# Patient Record
Sex: Female | Born: 1965 | Race: White | Hispanic: No | Marital: Married | Smoking: Current every day smoker
Health system: Southern US, Community
[De-identification: ages and names within clinical notes are randomized; demographics above are authoritative.]

## PROBLEM LIST (undated history)

## (undated) DIAGNOSIS — E079 Disorder of thyroid, unspecified: Secondary | ICD-10-CM

## (undated) DIAGNOSIS — Z972 Presence of dental prosthetic device (complete) (partial): Secondary | ICD-10-CM

## (undated) DIAGNOSIS — J449 Chronic obstructive pulmonary disease, unspecified: Secondary | ICD-10-CM

## (undated) HISTORY — PX: WRIST GANGLION EXCISION: SUR520

---

## 1990-03-06 HISTORY — PX: BREAST CYST ASPIRATION: SHX578

## 2009-04-03 ENCOUNTER — Emergency Department: Payer: Self-pay | Admitting: Emergency Medicine

## 2011-02-17 ENCOUNTER — Ambulatory Visit: Payer: Self-pay | Admitting: Internal Medicine

## 2011-06-24 ENCOUNTER — Emergency Department: Payer: Self-pay | Admitting: Internal Medicine

## 2011-06-27 LAB — BETA STREP CULTURE(ARMC)

## 2011-10-18 ENCOUNTER — Ambulatory Visit: Payer: Self-pay | Admitting: Emergency Medicine

## 2012-02-17 ENCOUNTER — Ambulatory Visit: Payer: Self-pay

## 2012-02-17 LAB — CBC
HCT: 43.6 % (ref 35.0–47.0)
MCH: 32.4 pg (ref 26.0–34.0)
MCV: 98 fL (ref 80–100)
RBC: 4.46 10*6/uL (ref 3.80–5.20)
RDW: 13 % (ref 11.5–14.5)
WBC: 4.4 10*3/uL (ref 3.6–11.0)

## 2012-02-17 LAB — BASIC METABOLIC PANEL
Anion Gap: 7 (ref 7–16)
Calcium, Total: 9.3 mg/dL (ref 8.5–10.1)
Co2: 28 mmol/L (ref 21–32)
EGFR (African American): >60
Osmolality: 281 (ref 275–301)

## 2012-02-18 ENCOUNTER — Inpatient Hospital Stay: Payer: Self-pay | Admitting: Internal Medicine

## 2012-02-18 LAB — MAGNESIUM: Magnesium: 1.5 mg/dL — ABNORMAL LOW

## 2012-02-19 LAB — CBC WITH DIFFERENTIAL/PLATELET
Basophil #: 0 10*3/uL (ref 0.0–0.1)
Eosinophil #: 0 10*3/uL (ref 0.0–0.7)
Eosinophil %: 0 %
HCT: 40.7 % (ref 35.0–47.0)
Lymphocyte #: 0.8 10*3/uL — ABNORMAL LOW (ref 1.0–3.6)
Lymphocyte %: 11.7 %
MCH: 32.2 pg (ref 26.0–34.0)
MCV: 97 fL (ref 80–100)
Monocyte #: 0.5 x10 3/mm (ref 0.2–0.9)
Neutrophil %: 81 %
Platelet: 166 10*3/uL (ref 150–440)
RBC: 4.18 10*6/uL (ref 3.80–5.20)
RDW: 13 % (ref 11.5–14.5)
WBC: 7 10*3/uL (ref 3.6–11.0)

## 2012-02-19 LAB — BASIC METABOLIC PANEL
Anion Gap: 6 — ABNORMAL LOW (ref 7–16)
BUN: 11 mg/dL (ref 7–18)
Co2: 30 mmol/L (ref 21–32)
Creatinine: 0.49 mg/dL — ABNORMAL LOW (ref 0.60–1.30)
EGFR (African American): >60
EGFR (Non-African Amer.): >60
Glucose: 120 mg/dL — ABNORMAL HIGH (ref 65–99)
Osmolality: 282 (ref 275–301)
Sodium: 141 mmol/L (ref 136–145)

## 2012-02-19 LAB — TSH: Thyroid Stimulating Horm: 6.25 u[IU]/mL — ABNORMAL HIGH

## 2012-02-19 LAB — T4, FREE: Free Thyroxine: 0.67 ng/dL — ABNORMAL LOW (ref 0.76–1.46)

## 2012-02-19 LAB — HCG, QUANTITATIVE, PREGNANCY: Beta Hcg, Quant.: 2 m[IU]/mL

## 2012-06-23 ENCOUNTER — Emergency Department: Payer: Self-pay | Admitting: Emergency Medicine

## 2013-08-16 ENCOUNTER — Ambulatory Visit: Payer: Self-pay | Admitting: Emergency Medicine

## 2013-12-22 IMAGING — CR DG CHEST 2V
1 series · 2 of 2 positions shown · non-contrast
Comparison: none

REASON FOR EXAM: SOB
COMMENTS:

[Series 1: pa · 0.17mm/px · 2 of 2 slices shown]
[im 1/2]
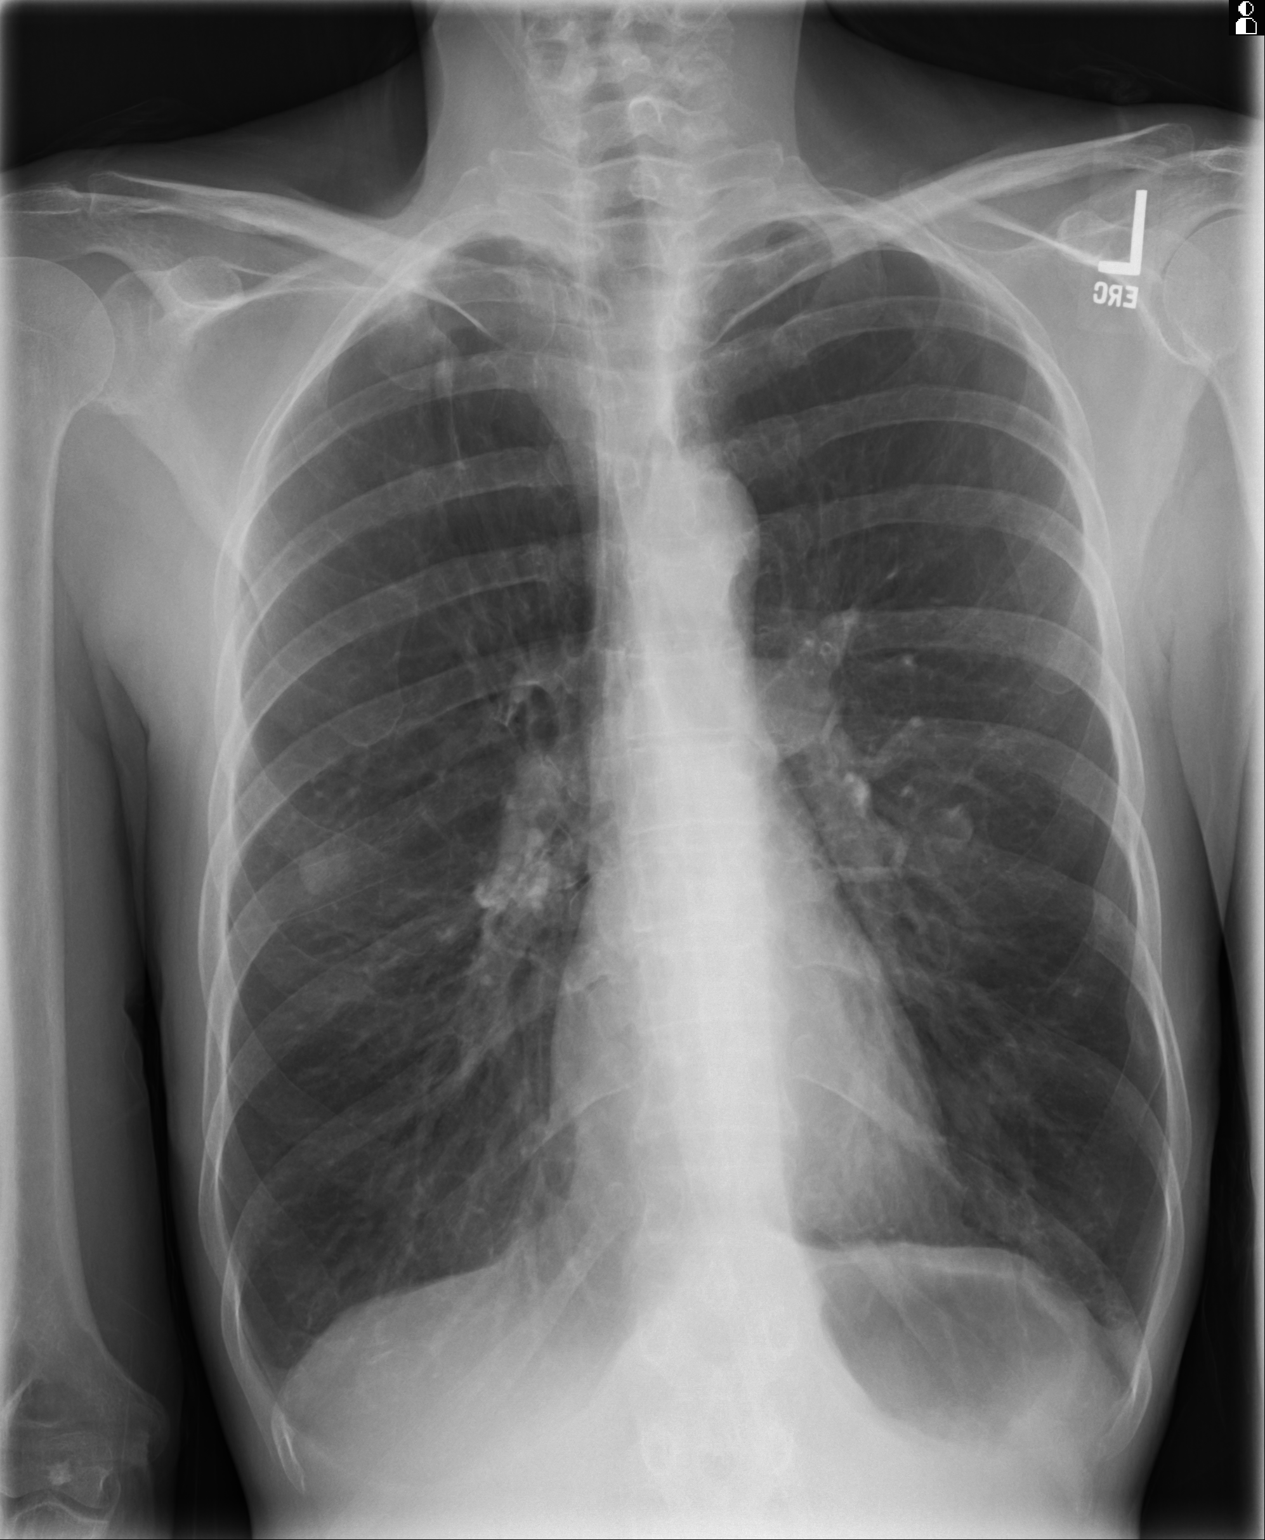
[im 2/2]
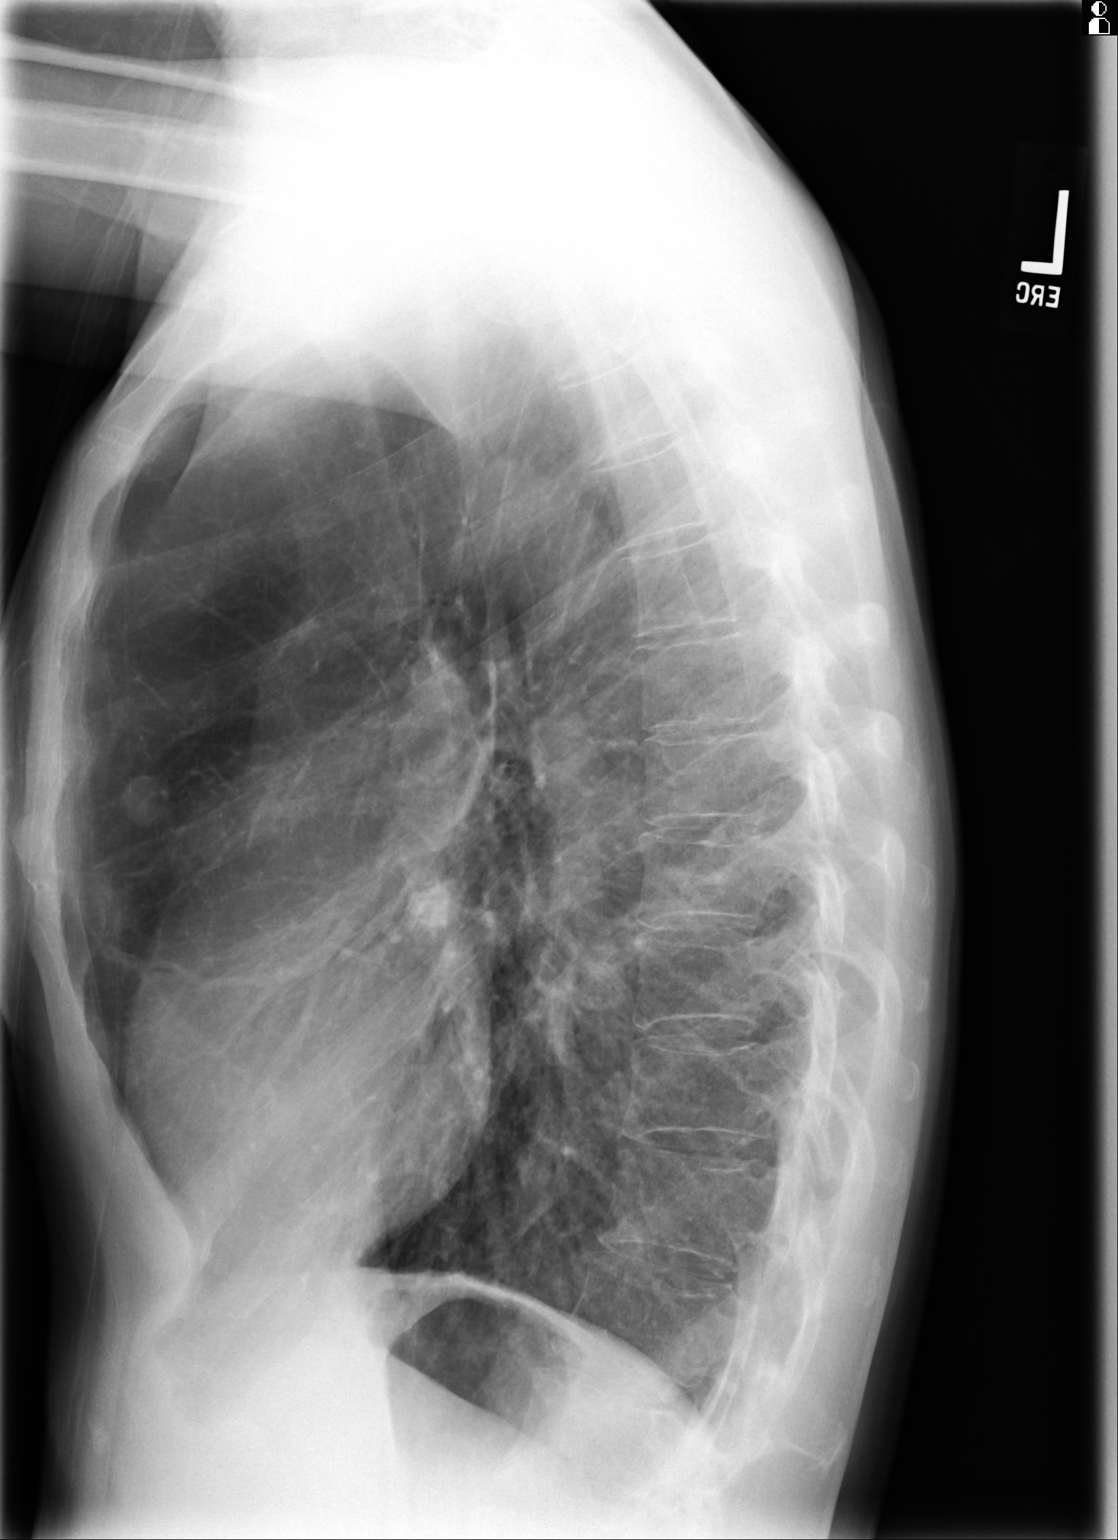

[2 of 2 positions shown; findings below may reference images not displayed]

PROCEDURE:     DXR - DXR CHEST PA (OR AP) AND LATERAL  - February 17, 2012 [DATE]

RESULT:     The lungs are hyperinflated consistent with emphysematous lung
disease. The cardiac silhouette is normal. There is an ill-defined nodular
density projecting of the lower margin of the anterior fifth rib. This
appears to project over the anterior lung field on lateral view. This may
represent a nipple shadow. Followup PA and lateral views of the chest with
nipple markers are recommended. There is no effusion, edema, pneumothorax or
other mass.
IMPRESSION: Nodular density presumably secondary to nipple shadow.
Followup PA and lateral views with nipple markers is recommended. Diffuse
hyperinflation consistent with emphysematous lung disease.

[REDACTED]

## 2014-06-23 NOTE — H&P (Signed)
PATIENT NAME:  April Sweeney, April Sweeney DATE OF BIRTH:  1965/07/25  DATE OF ADMISSION:  02/18/2012  REFERRING PHYSICIAN: Dr. Lucrezia EuropeAllison Webster.   PRIMARY CARE PHYSICIAN: None.   CHIEF COMPLAINT: Shortness of breath, cough, productive sputum.   HISTORY OF PRESENT ILLNESS: This is a 49 year old female with history of hypothyroidism, who presents with complaints of cough, productive sputum and shortness of breath. The patient reports she has been having these symptoms for the last 2 to 3 days. [facility], where she was given steroids, started on azithromycin and given nebulized treatment. The patient reports she went home and her symptoms improved, and she reports her cough and shortness of breath has been worsening, which prompted her to come to the ED. In the ED, the patient was noted to be saturating in the mid 80s, and while ambulating she dropped to the 70s. She had decreased air entry with diffuse wheezing and despite receiving multiple nebs and IV Solu-Medrol she is still having significant wheezing, so hospitalist service was requested to admit the patient. The patient denies any previous diagnosis of COPD. The patient reports she has been having productive cough with dark green sputum. The patient reports she already took p.o. azithromycin today at home as she was prescribed by urgent care. The patient was noticed to have multiple PVCs. Her magnesium level was 1.5, which was replaced in the ED. The patient reports she has history of hypothyroidism, but she stopped taking thyroid Armour on her own before _____ and reports she has not been seeing PCP or any physician since then.   PAST MEDICAL HISTORY: Hypothyroidism, but not taking any meds for that.   PAST SURGICAL HISTORY:  1.  Cyst removal from her arm.  2.  C-section.   ALLERGIES: PERCOCET.   HOME MEDICATIONS: None. She is supposed to be taking thyroid Amour, but she has not taken it for 1 year.   FAMILY  HISTORY: Significant for hypertension in her mother.   SOCIAL HISTORY: The patient smokes 1 pack per day since the age of 49, so has been smoking for the last 34 years. Reports she decided to stop smoking and today is the first day she stopped smoking. No alcohol or illicit drug use. She works in Clinical biochemistcustomer service.   REVIEW OF SYSTEMS:  CONSTITUTIONAL: Denies any fever or chills. Complains of generalized weakness and fatigue.  EYES: Denies blurry vision, double vision or pain.  ENT: Denies tinnitus, ear pain, hearing loss.  RESPIRATORY: Complains of cough with green, productive sputum. Has wheezing. Denies any painful respiratory or history of TB.  CARDIOVASCULAR: Denies chest pain, orthopnea, edema, arrhythmia or palpitations.  GASTROINTESTINAL: Denies nausea, vomiting, diarrhea, abdominal pain, hematemesis.  GENITORUINARY: Denies dysuria, hematuria, renal colic.  ENDOCRINE: Denies polyuria, polydipsia, heat or cold intolerance.  HEMATOLOGY: Denies anemia, easy bruising, bleeding diathesis.  INTEGUMENTARY: Denies acne, rash or skin lesions.  MUSCULOSKELETAL: Denies neck pain, shoulder pain, knee pain.  NEUROLOGIC: Denies numbness, dysarthria, epilepsy, tremors, vertigo, ataxia, dementia.  PSYCHIATRIC: Denies anxiety, insomnia, schizophrenia or nervousness.   PHYSICAL EXAMINATION:  VITAL SIGNS: Temperature 98.1, pulse 99, respiratory 22, blood pressure 131/62, saturating 91% on oxygen.  GENERAL: Frail female sitting comfortably in no apparent distress.  HEENT: Head is atraumatic, normocephalic. Pupils equal and reactive to light. Pink conjunctivae. Anicteric sclerae. Moist oral mucosa.  NECK: Supple. No thyromegaly. No JVD.  CHEST: Decreased air entry bilaterally with diffuse wheezing. No rales or rhonchi.  CARDIOVASCULAR: S1, S2  heard. No rubs, murmurs, or gallops.  ABDOMEN: Soft, nontender, nondistended. Bowel sounds present.  EXTREMITIES: No edema. No clubbing. No cyanosis.   PSYCHIATRIC: Appropriate affect. Awake, alert x3. Intact judgment and insight and cranial nerves grossly intact. Motor 5 out of 5.  SKIN: Warm and dry. Normal skin turgor.   PERTINENT LABORATORIES: Glucose 139, BUN 6, creatinine 0.58, sodium 141, potassium 3.5, chloride 106, CO2 28, magnesium 1.5. White blood cells 4.4, hemoglobin 14.4, hematocrit 43.6, platelets 177. Chest x-ray does not show any infiltrate. It shows COPD presentation with hyperextended lungs. Still awaiting official reading.   ASSESSMENT AND PLAN:  1.  Chronic obstructive pulmonary disease exacerbation, this is new diagnosis for the patient. The patient will be started on IV Solu-Medrol 80 mg every 6 hours and DuoNeb every 4 hours and p.r.n. albuterol. She will be kept on oxygen via nasal cannula to keep her oxygen saturation more than  95%. As she is having productive cough with dark green sputum, she will be started on broad-spectrum intravenous antibiotics. She will be started on intravenous Zosyn. At this point, we will avoid erythromycin or Levaquin secondary to QT prolongation.  2.  History of hypothyroidism. The patient stopped taking thyroid Armour on her own before 1 year. Will check TSH and FT4 and if needed will resume her on Synthroid.  3.  Tobacco abuse. The patient was counseled and reports she quit smoking. She will be started on Nicoderm patch.  4.  Multiple premature ventricular contractions. The patient was given magnesium. Will monitor her on telemetry.  5.  Hypomagnesemia. Replaced. We will recheck level in a.m.  6.  Deep vein thrombosis prophylaxis. Subcutaneous heparin.  7.  Gastrointestinal prophylaxis. We will start the patient on proton pump inhibitor, especially with her being on large dose of steroids.  8.  CODE STATUS: Full code.   TOTAL TIME SPENT ON ADMISSION AND PATIENT CARE: 55 minutes.   ____________________________ Starleen Arms, MD dse:aw D: 02/18/2012 05:39:06 ET T: 02/18/2012  13:15:44 ET JOB#: [id number]  cc: Starleen Arms, MD, <Dictator> Kenedee Molesky Teena Irani MD ELECTRONICALLY SIGNED 02/19/2012 4:24

## 2014-06-26 NOTE — Discharge Summary (Signed)
PATIENT NAME:  April Sweeney, GANSON MR#:  [id number] DATE OF BIRTH:  26-Mar-1965  DATE OF ADMISSION:  02/18/2012 DATE OF DISCHARGE:  02/22/2012  PRIMARY CARE PHYSICIAN: At the [facility].  FINAL DIAGNOSES:  1. Acute respiratory failure, now chronic requiring home oxygen.  2. Chronic obstructive pulmonary disease exacerbation.  3. Left lung nodule. Recommend a repeat CT scan of the chest in four months.  4. Tobacco abuse.  5. Insomnia. 6. Abnormal EKG.  7. Hypomagnesemia.  8. Constipation.   MEDICATIONS ON DISCHARGE: Include prednisone 5 mg 4 tabs day one, 3 tabs day two and three, 2 tabs day 4 and 5, 1 tab day 6 and 7, albuterol ipratropium CFC 1 puff 4 times a day, Spiriva 1 inhalation daily, Flovent CFC 110 mcg 1 puff twice a day, nicotine patch 21 mg per 24 hour transdermal _____ one patch daily, doxycycline 100 mg twice a day for 7 days, oxygen 3 liters nasal cannula with portable tank.   DIET: Regular.   ACTIVITY: As tolerated. I did give the number of a pulmonologist, Dr. Welton Flakes. Patient probably looking to get insurance first.  FOLLOWUP: Followup in the [facility] as scheduled.   HOSPITAL COURSE: The patient was admitted 02/18/2012 and discharged 02/22/2012. The  patient came in with shortness of breath, cough and productive sputum, was admitted with COPD exacerbation, acute respiratory failure. Was started on high dose Solu-Medrol. She also had an abnormal EKG with slight QT prolongation. Laboratory and radiological data during the hospital course included glucose 139, BUN 6, creatinine 0.58, sodium 141, potassium 3.5, chloride 106, CO2 of 28, calcium 9.3. White blood cell count 4.4, H and H 14.4 and 43.6, platelet count of 177. Chest x-ray showed nodular density presumably secondary to nipple shadow, emphysematous disease. EKG showed sinus bradycardia, AV disassociation, accelerated junctional rhythm, supraventricular complexes. Influenza A and B negative. Magnesium 1.5.  Beta HCG negative. TSH 6.25, free thyroxine 0.67. Repeat magnesium 1.8 CT scan of the chest with pulmonary embolism protocol showed no evidence of pulmonary embolism or acute thoracic aortic pathology, emphysematous changes bilaterally. They did comment on a 4 mm diameter nodule on the major fissure on the right. Pulse ox room air 88%.  HOSPITAL COURSE PER PROBLEM LIST:  1. For the patient's acute respiratory failure, this is now chronic requiring home oxygen. Patient will be on oxygen 24/7.  2. COPD exacerbation. The patient took a long time to break. High dose Solu-Medrol was given. That was tapered down to medium dose and flipped over to prednisone taper upon discharge. The patient was started on Combivent CFC, Flovent CFC and also given Spiriva. Prednisone taper was given. Doxycycline for COPD exacerbation. The patient's respiratory status had improved. She had very poor air entry when she came in. Was moving air upon discharge. Did have wheeze. She was feeling much better with regards to her breathing. The patient was discharged home in stable condition.  3. Lung nodule, very small, too small to biopsy. I recommend followup with a CT scan of the chest in 4 months to ensure stability.  4. Tobacco abuse. Smoking cessation counseling done during the hospitalization 3 minutes by me. Nicotine patch applied.  5. Insomnia: The patient was given Lunesta here. At home, she will probably sleep better.  6. Abnormal EKG most likely secondary to COPD. 7. Hypomagnesemia. This was replaced during the hospitalization.  8. Constipation. This was treated during the hospitalization.   TIME SPENT ON DISCHARGE: 40 minutes.    ____________________________  Ademola Vert J. Renae GlossWieting, MD rjw:es D: 02/22/2012 16:31:29 ET T: 02/23/2012 08:47:14 ET JOB#: [id number]  cc: Herschell Dimesichard J. Renae GlossWieting, MD, <Dictator> [facility] Salley ScarletICHARD J Naziah Portee MD ELECTRONICALLY SIGNED 03/07/2012 12:39

## 2014-06-29 ENCOUNTER — Ambulatory Visit
Admit: 2014-06-29 | Disposition: A | Discharge status: Home / Self Care | Payer: Self-pay | Attending: Family Medicine | Admitting: Family Medicine

## 2014-12-31 ENCOUNTER — Ambulatory Visit
Admission: EM | Admit: 2014-12-31 | Discharge: 2014-12-31 | Disposition: A | Discharge status: Home / Self Care | Payer: Self-pay | Attending: Family Medicine | Admitting: Family Medicine

## 2014-12-31 DIAGNOSIS — B370 Candidal stomatitis: Secondary | ICD-10-CM

## 2014-12-31 HISTORY — DX: Disorder of thyroid, unspecified: E07.9

## 2014-12-31 HISTORY — DX: Chronic obstructive pulmonary disease, unspecified: J44.9

## 2014-12-31 MED ORDER — NYSTATIN 100000 UNIT/ML MT SUSP: OROMUCOSAL | Status: DC

## 2014-12-31 NOTE — ED Provider Notes (Signed)
CSN: [id number]     Arrival date & time 12/31/14  1848 History   First MD Initiated Contact with Patient 12/31/14 1959     Chief Complaint  Patient presents with  . Mouth Lesions   (Consider location/radiation/quality/duration/timing/severity/associated sxs/prior Treatment) HPI   This a 49 year old female who been diagnosed with a URI. She's been using Spiriva and Advair and because of the cold; she has not been cleaning her mouth adequately following the Advair usage. She now presents with sores in her mouth and a white tongue. Complaining of a bitter taste in her mouth.  Past Medical History  Diagnosis Date  . COPD (chronic obstructive pulmonary disease) (HCC)   . Thyroid disease    Past Surgical History  Procedure Laterality Date  . Cesarean section    . Wrist ganglion excision     History reviewed. No pertinent family history. Social History  Substance Use Topics  . Smoking status: Current Every Day Smoker -- 0.25 packs/day  . Smokeless tobacco: Never Used  . Alcohol Use: No   OB History    Gravida Para Term Preterm AB TAB SAB Ectopic Multiple Living   2 2             Review of Systems  Constitutional: Negative for fever, appetite change and fatigue.  HENT: Positive for mouth sores.   All other systems reviewed and are negative.   Allergies  Percocet  Home Medications   Prior to Admission medications   Medication Sig Start Date End Date Taking? Authorizing Provider  albuterol (PROVENTIL HFA;VENTOLIN HFA) 108 (90 BASE) MCG/ACT inhaler Inhale into the lungs every 6 (six) hours as needed for wheezing or shortness of breath.   Yes Historical Provider, MD  doxycycline (VIBRAMYCIN) 100 MG capsule Take 100 mg by mouth 2 (two) times daily.   Yes Historical Provider, MD  Fluticasone-Salmeterol (ADVAIR) 100-50 MCG/DOSE AEPB Inhale 1 puff into the lungs 2 (two) times daily.   Yes Historical Provider, MD  levothyroxine (SYNTHROID, LEVOTHROID) 50 MCG tablet Take 50 mcg by  mouth daily before breakfast.   Yes Historical Provider, MD  predniSONE (DELTASONE) 20 MG tablet Take 20 mg by mouth daily with breakfast.   Yes Historical Provider, MD  tiotropium (SPIRIVA) 18 MCG inhalation capsule Place 18 mcg into inhaler and inhale daily.   Yes Historical Provider, MD  nystatin (MYCOSTATIN) 100000 UNIT/ML suspension 5 ml by mouth QID.Swish around for as long as possible then swallow. Use for 14 days. 12/31/14   Lutricia Feil, PA-C   Meds Ordered and Administered this Visit  Medications - No data to display  BP 129/87 mmHg  Pulse 73  Temp(Src) 98 F (36.7 C) (Oral)  Resp 16  Ht  (1.651 m)  Wt 114 lb (51.71 kg)  BMI 18.97 kg/m2  SpO2 99% No data found.   Physical Exam  Constitutional: She is oriented to person, place, and time. She appears well-developed and well-nourished. No distress.  HENT:  Head: Normocephalic and atraumatic.  Admission of her oropharynx shows a white coating on her tongue and white coating covering the buccal surfaces of her cheeks.. Adherent to the surface. There are no open sores. There is erythema underlying.  Eyes: Pupils are equal, round, and reactive to light.  Musculoskeletal: Normal range of motion. She exhibits no edema or tenderness.  Neurological: She is alert and oriented to person, place, and time.  Skin: Skin is warm and dry. She is not diaphoretic.  Psychiatric: She has a normal  mood and affect. Her behavior is normal. Judgment and thought content normal.  Nursing note and vitals reviewed.   ED Course  Procedures (including critical care time)  Labs Review Labs Reviewed - No data to display  Imaging Review No results found.   Visual Acuity Review  Right Eye Distance:   Left Eye Distance:   Bilateral Distance:    Right Eye Near:   Left Eye Near:    Bilateral Near:         MDM   1. Oral candidiasis    Discharge Medication List as of 12/31/2014  8:15 PM    START taking these medications    Details  nystatin (MYCOSTATIN) 100000 UNIT/ML suspension 5 ml by mouth QID.Swish around for as long as possible then swallow. Use for 14 days., Print      Plan: 1. Diagnosis reviewed with patient 2. rx as per orders; risks, benefits, potential side effects reviewed with patient 3. Recommend supportive treatment with strict washing of mouth. 4. F/u prn if symptoms worsen or don't improve     Lutricia FeilWilliam P Hence Derrick, PA-C 12/31/14 2029

## 2014-12-31 NOTE — ED Notes (Signed)
Possible thrush. Pt has been sick with a URI, and has been taking Spiriva, Advair, and a rescue inhaler.

## 2014-12-31 NOTE — Discharge Instructions (Signed)
Thrush, Adult  Thrush, also called oral candidiasis, is a fungal infection that develops in the mouth and throat and on the tongue. It causes white patches to form on the mouth and tongue. Thrush is most common in older adults, but it can occur at any age.   Many cases of thrush are mild, but this infection can also be more serious. Thrush can be a recurring problem for people who have chronic illnesses or who take medicines that limit the body's ability to fight infection. Because these people have difficulty fighting infections, the fungus that causes thrush can spread throughout the body. This can cause life-threatening blood or organ infections.  CAUSES   Thrush is usually caused by a yeast called Candida albicans. This fungus is normally present in small amounts in the mouth and on other mucous membranes. It usually causes no harm. However, when conditions are present that allow the fungus to grow uncontrolled, it invades surrounding tissues and becomes an infection. Less often, other Candida species can also lead to thrush.   RISK FACTORS  Thrush is more likely to develop in the following people:  · People with an impaired ability to fight infection (weakened immune system).    · Older adults.    · People with HIV.    · People with diabetes.    · People with dry mouth (xerostomia).    · Pregnant women.    · People with poor dental care, especially those who have false teeth.    · People who use antibiotic medicines.    SIGNS AND SYMPTOMS   Thrush can be a mild infection that causes no symptoms. If symptoms develop, they may include:   · A burning feeling in the mouth and throat. This can occur at the start of a thrush infection.    · White patches that adhere to the mouth and tongue. The tissue around the patches may be red, raw, and painful. If rubbed (during tooth brushing, for example), the patches and the tissue of the mouth may bleed easily.    · A bad taste in the mouth or difficulty tasting foods.     · Cottony feeling in the mouth.    · Pain during eating and swallowing.  DIAGNOSIS   Your health care provider can usually diagnose thrush by looking in your mouth and asking you questions about your health.   TREATMENT   Medicines that help prevent the growth of fungi (antifungals) are the standard treatment for thrush. These medicines are either applied directly to the affected area (topical) or swallowed (oral). The treatment will depend on the severity of the condition.   Mild Thrush  Mild cases of thrush may clear up with the use of an antifungal mouth rinse or lozenges. Treatment usually lasts about 14 days.   Moderate to Severe Thrush  · More severe thrush infections that have spread to the esophagus are treated with an oral antifungal medicine. A topical antifungal medicine may also be used.    · For some severe infections, a treatment period longer than 14 days may be needed.    · Oral antifungal medicines are almost never used during pregnancy because the fetus may be harmed. However, if a pregnant woman has a rare, severe thrush infection that has spread to her blood, oral antifungal medicines may be used. In this case, the risk of harm to the mother and fetus from the severe thrush infection may be greater than the risk posed by the use of antifungal medicines.    Persistent or Recurrent Thrush  For cases of   thrush that do not go away or keep coming back, treatment may involve the following:   · Treatment may be needed twice as long as the symptoms last.    · Treatment will include both oral and topical antifungal medicines.    · People with weakened immune systems can take an antifungal medicine on a continuous basis to prevent thrush infections.    It is important to treat conditions that make you more likely to get thrush, such as diabetes or HIV.   HOME CARE INSTRUCTIONS   · Only take over-the-counter or prescription medicine as directed by your health care provider. Talk to your health care  provider about an over-the-counter medicine called gentian violet, which kills bacteria and fungi.    · Eat plain, unflavored yogurt as directed by your health care provider. Check the label to make sure the yogurt contains live cultures. This yogurt can help healthy bacteria grow in the mouth that can stop the growth of the fungus that causes thrush.    · Try these measures to help reduce the discomfort of thrush:      Drink cold liquids such as water or iced tea.      Try flavored ice treats or frozen juices.      Eat foods that are easy to swallow, such as gelatin, ice cream, or custard.      If the patches in your mouth are painful, try drinking from a straw.    · Rinse your mouth several times a day with a warm saltwater rinse. You can make the saltwater mixture with 1 tsp (6 g) of salt in 8 fl oz (0.2 L) of warm water.    · If you wear dentures, remove the dentures before going to bed, brush them vigorously, and soak them in a cleaning solution as directed by your health care provider.    · Women who are breastfeeding should clean their nipples with an antifungal medicine as directed by their health care provider. Dry the nipples after breastfeeding. Applying lanolin-containing body lotion may help relieve nipple soreness.    SEEK MEDICAL CARE IF:  · Your symptoms are getting worse or are not improving within 7 days of starting treatment.    · You have symptoms of spreading infection, such as white patches on the skin outside of the mouth.    · You are nursing and you have redness, burning, or pain in the nipples that is not relieved with treatment.    MAKE SURE YOU:  · Understand these instructions.  · Will watch your condition.  · Will get help right away if you are not doing well or get worse.     This information is not intended to replace advice given to you by your health care provider. Make sure you discuss any questions you have with your health care provider.     Document Released: 11/16/2003 Document  Revised: 03/13/2014 Document Reviewed: 09/23/2012  [employer]vier Interactive Patient Education ©2016 [employer]

## 2015-03-21 ENCOUNTER — Encounter: Payer: Self-pay | Admitting: Gynecology

## 2015-03-21 ENCOUNTER — Ambulatory Visit
Admission: EM | Admit: 2015-03-21 | Discharge: 2015-03-21 | Disposition: A | Discharge status: Home / Self Care | Payer: Self-pay | Attending: Family Medicine | Admitting: Family Medicine

## 2015-03-21 DIAGNOSIS — J40 Bronchitis, not specified as acute or chronic: Secondary | ICD-10-CM

## 2015-03-21 DIAGNOSIS — J0100 Acute maxillary sinusitis, unspecified: Secondary | ICD-10-CM

## 2015-03-21 MED ORDER — GUAIFENESIN-CODEINE 100-10 MG/5ML PO SOLN: 10.0000 mL | Freq: Every evening | ORAL | Status: DC | PRN

## 2015-03-21 MED ORDER — BENZONATATE 100 MG PO CAPS: 100.0000 mg | ORAL_CAPSULE | Freq: Three times a day (TID) | ORAL | Status: DC | PRN

## 2015-03-21 MED ORDER — AMOXICILLIN-POT CLAVULANATE 875-125 MG PO TABS: 1.0000 | ORAL_TABLET | Freq: Two times a day (BID) | ORAL | Status: DC

## 2015-03-21 NOTE — Discharge Instructions (Signed)
Take medication as prescribed. Rest. Drink plenty of fluids.  ° °Follow up with your primary care physician this week as needed. Return to Urgent care for new or worsening concerns.  ° ° °Sinusitis, Adult °Sinusitis is redness, soreness, and inflammation of the paranasal sinuses. Paranasal sinuses are air pockets within the bones of your face. They are located beneath your eyes, in the middle of your forehead, and above your eyes. In healthy paranasal sinuses, mucus is able to drain out, and air is able to circulate through them by way of your nose. However, when your paranasal sinuses are inflamed, mucus and air can become trapped. This can allow bacteria and other germs to grow and cause infection. °Sinusitis can develop quickly and last only a short time (acute) or continue over a long period (chronic). Sinusitis that lasts for more than 12 weeks is considered chronic. °CAUSES °Causes of sinusitis include: °· Allergies. °· Structural abnormalities, such as displacement of the cartilage that separates your nostrils (deviated septum), which can decrease the air flow through your nose and sinuses and affect sinus drainage. °· Functional abnormalities, such as when the small hairs (cilia) that line your sinuses and help remove mucus do not work properly or are not present. °SIGNS AND SYMPTOMS °Symptoms of acute and chronic sinusitis are the same. The primary symptoms are pain and pressure around the affected sinuses. Other symptoms include: °· Upper toothache. °· Earache. °· Headache. °· Bad breath. °· Decreased sense of smell and taste. °· A cough, which worsens when you are lying flat. °· Fatigue. °· Fever. °· Thick drainage from your nose, which often is green and may contain pus (purulent). °· Swelling and warmth over the affected sinuses. °DIAGNOSIS °Your health care provider will perform a physical exam. During your exam, your health care provider may perform any of the following to help determine if you have  acute sinusitis or chronic sinusitis: °· Look in your nose for signs of abnormal growths in your nostrils (nasal polyps). °· Tap over the affected sinus to check for signs of infection. °· View the inside of your sinuses using an imaging device that has a light attached (endoscope). °If your health care provider suspects that you have chronic sinusitis, one or more of the following tests may be recommended: °· Allergy tests. °· Nasal culture. A sample of mucus is taken from your nose, sent to a lab, and screened for bacteria. °· Nasal cytology. A sample of mucus is taken from your nose and examined by your health care provider to determine if your sinusitis is related to an allergy. °TREATMENT °Most cases of acute sinusitis are related to a viral infection and will resolve on their own within 10 days. Sometimes, medicines are prescribed to help relieve symptoms of both acute and chronic sinusitis. These may include pain medicines, decongestants, nasal steroid sprays, or saline sprays. °However, for sinusitis related to a bacterial infection, your health care provider will prescribe antibiotic medicines. These are medicines that will help kill the bacteria causing the infection. °Rarely, sinusitis is caused by a fungal infection. In these cases, your health care provider will prescribe antifungal medicine. °For some cases of chronic sinusitis, surgery is needed. Generally, these are cases in which sinusitis recurs more than 3 times per year, despite other treatments. °HOME CARE INSTRUCTIONS °· Drink plenty of water. Water helps thin the mucus so your sinuses can drain more easily. °· Use a humidifier. °· Inhale steam 3-4 times a day (for example, sit in   the bathroom with the shower running). °· Apply a warm, moist washcloth to your face 3-4 times a day, or as directed by your health care provider. °· Use saline nasal sprays to help moisten and clean your sinuses. °· Take medicines only as directed by your health care  provider. °· If you were prescribed either an antibiotic or antifungal medicine, finish it all even if you start to feel better. °SEEK IMMEDIATE MEDICAL CARE IF: °· You have increasing pain or severe headaches. °· You have nausea, vomiting, or drowsiness. °· You have swelling around your face. °· You have vision problems. °· You have a stiff neck. °· You have difficulty breathing. °  °This information is not intended to replace advice given to you by your health care provider. Make sure you discuss any questions you have with your health care provider. °  °Document Released: 02/20/2005 Document Revised: 03/13/2014 Document Reviewed: 03/07/2011 °Elsevier Interactive Patient Education ©2016 [employer] ° °

## 2015-03-21 NOTE — ED Notes (Signed)
Patient c/o cough greenish mucous x 3 weeks ago.

## 2015-03-21 NOTE — ED Provider Notes (Signed)
[facility]  ____________________________________________  Time seen: Approximately 10:32 AM  I have reviewed the triage vital signs and the nursing notes.   HISTORY  Chief Complaint Cough  HPI Damonique Lynford CitizenS Raven is a 50 y.o. female presents for the complaints of  3 weeks of runny nose, nasal congestion, sinus drainage and sinus pressure with intermittent cough. Patient states that the cough is primarily at night and first thing in the morning. Patient states that occasionally the cough is productive with greenish mucus. Reports frequently getting thick greenish nasal drainage from nose and blowing nose. Patient reports sinuses are clogged causing pressure at 2 out of 10. Denies shortness of breath or wheezing.  Denies chest pain or shortness of breath, abdominal pain, dizziness, weakness, known fevers. Reports continues to eat and drink well. Denies known sick contacts. Reports symptoms have been unresolved with over-the-counter cough cold congestion medications.  PCP: Yvetta CoderBender, [place] ; Reports next appointment in 2 weeks.   Past Medical History  Diagnosis Date  . COPD (chronic obstructive pulmonary disease) (HCC)   . Thyroid disease     There are no active problems to display for this patient.   Past Surgical History  Procedure Laterality Date  . Cesarean section    . Wrist ganglion excision      Current Outpatient Rx  Name  Route  Sig  Dispense  Refill  . albuterol (PROVENTIL HFA;VENTOLIN HFA) 108 (90 BASE) MCG/ACT inhaler   Inhalation   Inhale into the lungs every 6 (six) hours as needed for wheezing or shortness of breath.         . Fluticasone-Salmeterol (ADVAIR) 100-50 MCG/DOSE AEPB   Inhalation   Inhale 1 puff into the lungs 2 (two) times daily.         Marland Kitchen. levothyroxine (SYNTHROID, LEVOTHROID) 50 MCG tablet   Oral   Take 50 mcg by mouth daily before breakfast.         . tiotropium (SPIRIVA) 18 MCG inhalation capsule   Inhalation   Place 18  mcg into inhaler and inhale daily.         .           .           .            Last menstrual: 3 weeks ago. Denies chance of pregnancy. Allergies Percocet  No family history on file.  Social History Social History  Substance Use Topics  . Smoking status: Current Every Day Smoker -- 0.25 packs/day  . Smokeless tobacco: Never Used  . Alcohol Use: No    Review of Systems Constitutional: No fever/chills Eyes: No visual changes. ENT: positive runny nose, nasal congestion, sinus pressure, sinus congestion, occasional sore throat. Cardiovascular: Denies chest pain. Respiratory: Denies shortness of breath. Gastrointestinal: No abdominal pain.  No nausea, no vomiting.  No diarrhea.  No constipation. Genitourinary: Negative for dysuria. Musculoskeletal: Negative for back pain. Skin: Negative for rash. Neurological: Negative for headaches, focal weakness or numbness.  10-point ROS otherwise negative.  ____________________________________________   PHYSICAL EXAM:  VITAL SIGNS: ED Triage Vitals  Enc Vitals Group     BP 03/21/15 0922 118/74 mmHg     Pulse Rate 03/21/15 0922 76     Resp 03/21/15 0922 16     Temp 03/21/15 0922 98 F (36.7 C)     Temp Source 03/21/15 0922 Oral     SpO2 03/21/15 0922 99 %     Weight 03/21/15 0922  114 lb (51.71 kg)     Height 03/21/15 0922 5\' 5"  (1.651 m)     Head Cir --      Peak Flow --      Pain Score 03/21/15 0927 3     Pain Loc --      Pain Edu? --      Excl. in GC? --    Constitutional: Alert and oriented. Well appearing and in no acute distress. Eyes: Conjunctivae are normal. PERRL. EOMI. Head: Atraumatic. Mild to moderate tenderness to palpation maxillary sinuses and mild to bilateral frontal sinuses. No erythema. No swelling.  Ears: no erythema, normal TMs bilaterally.   Nose: Nasal congestion, nasal turbinate erythema and edema. Greenish nasal drainage.  Mouth/Throat: Mucous membranes are moist.  Mild pharyngeal erythema . No  tonsillar swelling or exudate.  Neck: No stridor.  No cervical spine tenderness to palpation. Hematological/Lymphatic/Immunilogical: No cervical lymphadenopathy. Cardiovascular: Normal rate, regular rhythm. Grossly normal heart sounds.  Good peripheral circulation. Respiratory: Normal respiratory effort. No retractions. Lungs CTAB. No wheezes, rales or rhonchi. Dry intermittent cough in room.  Gastrointestinal: Soft and nontender.  Musculoskeletal: No lower or upper extremity tenderness nor edema.  No calf tenderness bilaterally. Bilateral pedal pulses equal and easily palpated.  Neurologic:  Normal speech and language. No gross focal neurologic deficits are appreciated. No gait instability. Skin:  Skin is warm, dry and intact. No rash noted. Psychiatric: Mood and affect are normal. Speech and behavior are normal.   ____________________________________________   LABS (all labs ordered are listed, but only abnormal results are displayed)  Labs Reviewed - No data to display   INITIAL IMPRESSION / ASSESSMENT AND PLAN / ED COURSE  Pertinent labs & imaging results that were available during my care of the patient were reviewed by me and considered in my medical decision making (see chart for details).  very well-appearing patient. No acute distress. Presents for the complaints of 3 weeks of runny nose, nasal congestion, sinus drainage. Reports intermittent cough. Patient reports unrelieved with over-the-counter medications. Lungs clear throughout. Abdomen soft and nontender. Will treat sinusitis and suspected bronchitis with oral Augmentin, and when necessary Tessalon Perles during the day as well as when necessary guaifenesin with codeine at night as patient reports that she has tolerated codeine well in the past. Encouraged rest, fluids and easily be follow-up.  Discussed follow up with Primary care physician this week. Discussed follow up and return parameters including no resolution or any  worsening concerns. Patient verbalized understanding and agreed to plan.   ____________________________________________   FINAL CLINICAL IMPRESSION(S) / ED DIAGNOSES  Final diagnoses:  Acute maxillary sinusitis, recurrence not specified  Bronchitis       Renford Dills, NP 03/21/15 1209  Renford Dills, NP 03/21/15 1210

## 2015-04-29 ENCOUNTER — Ambulatory Visit
Admission: EM | Admit: 2015-04-29 | Discharge: 2015-04-29 | Disposition: A | Discharge status: Home / Self Care | Payer: Self-pay | Attending: Emergency Medicine | Admitting: Emergency Medicine

## 2015-04-29 DIAGNOSIS — B349 Viral infection, unspecified: Secondary | ICD-10-CM

## 2015-04-29 DIAGNOSIS — J069 Acute upper respiratory infection, unspecified: Secondary | ICD-10-CM

## 2015-04-29 LAB — URINALYSIS COMPLETE WITH MICROSCOPIC (ARMC ONLY)
Bilirubin Urine: NEGATIVE
GLUCOSE, UA: NEGATIVE mg/dL
Hgb urine dipstick: NEGATIVE
KETONES UR: NEGATIVE mg/dL
Leukocytes, UA: NEGATIVE
Nitrite: NEGATIVE
Protein, ur: NEGATIVE mg/dL
WBC UA: NONE SEEN WBC/hpf (ref 0–5)
pH: 6 (ref 5.0–8.0)

## 2015-04-29 LAB — RAPID INFLUENZA A&B ANTIGENS (ARMC ONLY): INFLUENZA A (ARMC): NOT DETECTED

## 2015-04-29 LAB — RAPID STREP SCREEN (MED CTR MEBANE ONLY): STREPTOCOCCUS, GROUP A SCREEN (DIRECT): NEGATIVE

## 2015-04-29 LAB — RAPID INFLUENZA A&B ANTIGENS: Influenza B (ARMC): NOT DETECTED

## 2015-04-29 MED ORDER — DOXYCYCLINE HYCLATE 100 MG PO CAPS: 100.0000 mg | ORAL_CAPSULE | Freq: Two times a day (BID) | ORAL | Status: DC

## 2015-04-29 MED ORDER — GUAIFENESIN-CODEINE 100-10 MG/5ML PO SOLN: 10.0000 mL | Freq: Every evening | ORAL | Status: DC | PRN

## 2015-04-29 MED ORDER — PREDNISONE 50 MG PO TABS: 50.0000 mg | ORAL_TABLET | Freq: Every day | ORAL | Status: DC

## 2015-04-29 NOTE — Discharge Instructions (Signed)
Do your albuterol every 4-6 hours as needed for coughing and wheezing. Start some saline nasal earring dictation for the nasal congestion. I have given you doxycycline, an antibiotic, because you have COPD and this may be an early bronchitis/COPD exacerbation. Also take the prednisone. Go to the ER if you get worse. Otherwise follow-up with your primary care physician as needed

## 2015-04-29 NOTE — ED Notes (Addendum)
Patient c/o sore throat, cough, body aches, and headache which started 2 days ago.  Also she states that she has a rash around her right elbow that she would like to be checked out as well.

## 2015-04-29 NOTE — ED Provider Notes (Addendum)
HPI  SUBJECTIVE:  April Sweeney is a 50 y.o. female who presents with sore throat, cough productive of whitish mucous, headache last night, body aches, rhinorrhea starting 2 days ago. Symptoms better with ibuprofen, last dose within the past 46 hours, has also tried Alka-Seltzer, Tylenol severe cold. There are no aggravating factors. She denies nausea, vomiting, fevers, ear pain, sinus pain/pressure, postnasal drip, visual changes, stiff neck, new rash. No chest pain, wheezing, shortness of breath, or need to use her inhaler more. No abdominal pain, urinary urgency, frequency, cloudy or odorous urine, hematuria, back pain. No sick contacts. She got a flu shot this year. Past medical history of COPD, states that she is compliant with her medications. Still smoking. No history of diabetes, hypertension. PMD: Dr. Hessie Diener at the [facility].  Past Medical History  Diagnosis Date  . COPD (chronic obstructive pulmonary disease) (HCC)   . Thyroid disease     Past Surgical History  Procedure Laterality Date  . Cesarean section    . Wrist ganglion excision      History reviewed. No pertinent family history.  Social History  Substance Use Topics  . Smoking status: Current Every Day Smoker -- 0.25 packs/day  . Smokeless tobacco: Never Used  . Alcohol Use: No    No current facility-administered medications for this encounter.  Current outpatient prescriptions:  .  Fluticasone-Salmeterol (ADVAIR) 100-50 MCG/DOSE AEPB, Inhale 1 puff into the lungs 2 (two) times daily., Disp: , Rfl:  .  levothyroxine (SYNTHROID, LEVOTHROID) 50 MCG tablet, Take 75 mcg by mouth daily before breakfast. , Disp: , Rfl:  .  tiotropium (SPIRIVA) 18 MCG inhalation capsule, Place 18 mcg into inhaler and inhale daily., Disp: , Rfl:  .  albuterol (PROVENTIL HFA;VENTOLIN HFA) 108 (90 BASE) MCG/ACT inhaler, Inhale into the lungs every 6 (six) hours as needed for wheezing or shortness of breath., Disp: , Rfl:  .   doxycycline (VIBRAMYCIN) 100 MG capsule, Take 1 capsule (100 mg total) by mouth 2 (two) times daily. X 7 days, Disp: 14 capsule, Rfl: 0 .  guaiFENesin-codeine 100-10 MG/5ML syrup, Take 10 mLs by mouth at bedtime as needed for cough., Disp: 120 mL, Rfl: 0 .  nystatin (MYCOSTATIN) 100000 UNIT/ML suspension, 5 ml by mouth QID.Swish around for as long as possible then swallow. Use for 14 days., Disp: 240 mL, Rfl: 0 .  predniSONE (DELTASONE) 50 MG tablet, Take 1 tablet (50 mg total) by mouth daily with breakfast., Disp: 5 tablet, Rfl: 0  Allergies  Allergen Reactions  . Percocet [Oxycodone-Acetaminophen] Hives     ROS  As noted in HPI.   Physical Exam  BP 110/63 mmHg  Pulse 80  Temp(Src) 98.5 F (36.9 C) (Oral)  Resp 18  Ht  (1.651 m)  Wt 114 lb (51.71 kg)  BMI 18.97 kg/m2  SpO2 96%  Constitutional: Well developed, well nourished, no acute distress Eyes: PERRL, EOMI, conjunctiva normal bilaterally HENT: Normocephalic, atraumatic,mucus membranes moist. TMs normal bilaterally. Mild nasal congestion. Normal turbinates. No sinus tenderness. Slightly erythematous oropharynx, normal tonsils. Uvula midline. Lymph: Shotty cervical lymphadenopathy  Respiratory: Good air movement, Clear to auscultation bilaterally, no rales, no wheezing, no rhonchi Cardiovascular: Normal rate and rhythm, no murmurs, no gallops, no rubs GI: Soft, nondistended, normal bowel sounds, positive suprapubic tenderness, no rebound, no guarding. No flank tenderness no other abdominal tenderness Back: no CVAT skin: skin intact Musculoskeletal: No edema, no tenderness, no deformities Neurologic: Alert & oriented x 3, CN II-XII grossly intact, no  motor deficits, sensation grossly intact Psychiatric: Speech and behavior appropriate   ED Course   Medications - No data to display  Orders Placed This Encounter  Procedures  . Rapid Influenza A&B Antigens ([facility] only)    Standing Status: Standing     Number of  Occurrences: 1     Standing Expiration Date:   . Rapid strep screen    Standing Status: Standing     Number of Occurrences: 1     Standing Expiration Date:   . Culture, group A strep    Standing Status: Standing     Number of Occurrences: 1     Standing Expiration Date:   . Urinalysis complete, with microscopic    Standing Status: Standing     Number of Occurrences: 1     Standing Expiration Date:   . Droplet precaution    Standing Status: Standing     Number of Occurrences: 1     Standing Expiration Date:    Results for orders placed or performed during the hospital encounter of 04/29/15 (from the past 24 hour(s))  Rapid Influenza A&B Antigens ([facility] only)     Status: None   Collection Time: 04/29/15  9:31 AM  Result Value Ref Range   Influenza A ([facility]) NOT DETECTED    Influenza B ([facility]) NOT DETECTED   Rapid strep screen     Status: None   Collection Time: 04/29/15  9:31 AM  Result Value Ref Range   Streptococcus, Group A Screen (Direct) NEGATIVE NEGATIVE  Urinalysis complete, with microscopic     Status: Abnormal   Collection Time: 04/29/15 10:28 AM  Result Value Ref Range   Color, Urine YELLOW YELLOW   APPearance CLEAR CLEAR   Glucose, UA NEGATIVE NEGATIVE mg/dL   Bilirubin Urine NEGATIVE NEGATIVE   Ketones, ur NEGATIVE NEGATIVE mg/dL   Specific Gravity, Urine <1.005 (L) 1.005 - 1.030   Hgb urine dipstick NEGATIVE NEGATIVE   pH 6.0 5.0 - 8.0   Protein, ur NEGATIVE NEGATIVE mg/dL   Nitrite NEGATIVE NEGATIVE   Leukocytes, UA NEGATIVE NEGATIVE   RBC / HPF 0-5 0 - 5 RBC/hpf   WBC, UA NONE SEEN 0 - 5 WBC/hpf   Bacteria, UA FEW (A) NONE SEEN   Squamous Epithelial / LPF 0-5 (A) NONE SEEN   No results found.  ED Clinical Impression  Viral syndrome  URI (upper respiratory infection)   ED Assessment/Plan  No evidence of otitis, sinusitis. Lungs are clear today. Afebrile here, but has taken ibuprofen recently. She is not tachycardic, she is satting 96 % on room air.  Doubt pneumonia at this time. Deferring imaging today. She has suprapubic tenderness, we'll check UA to rule out UTI. Doubt pyelonephritis as she has no CVA tenderness.  Flu negative. Strep negative. UA negative for UTI.   Presentation consistent with URI/ viral syndrome may have early bronchitis. Home with supportive treatment, doxycycline 100 mg twice a day for 7 days, prednisone 50 mg 5 days, regular bronchodilators. Patient states she has plenty of pulmonary medications at home. Follow-up with primary care as needed. To the ER if gets worse..  Discussed labs,  MDM, plan and followup with patient. Discussed sn/sx that should prompt return to the UC or ED. Patient  agrees with plan.  *This clinic note was created using Dragon dictation software. Therefore, there may be occasional mistakes despite careful proofreading.  ?  Domenick Gong, MD 04/29/15 1337  Domenick Gong, MD 04/29/15 [phone]

## 2015-05-01 LAB — CULTURE, GROUP A STREP (THRC)

## 2016-06-22 ENCOUNTER — Ambulatory Visit
Admission: EM | Admit: 2016-06-22 | Discharge: 2016-06-22 | Disposition: A | Discharge status: Home / Self Care | Payer: Self-pay | Attending: Family Medicine | Admitting: Family Medicine

## 2016-06-22 DIAGNOSIS — J40 Bronchitis, not specified as acute or chronic: Secondary | ICD-10-CM

## 2016-06-22 DIAGNOSIS — R05 Cough: Secondary | ICD-10-CM

## 2016-06-22 DIAGNOSIS — J410 Simple chronic bronchitis: Secondary | ICD-10-CM

## 2016-06-22 MED ORDER — HYDROCOD POLST-CPM POLST ER 10-8 MG/5ML PO SUER: 5.0000 mL | Freq: Two times a day (BID) | ORAL | 0 refills | Status: DC

## 2016-06-22 MED ORDER — BENZONATATE 200 MG PO CAPS: ORAL_CAPSULE | ORAL | 0 refills | Status: DC

## 2016-06-22 MED ORDER — DOXYCYCLINE HYCLATE 100 MG PO CAPS: 100.0000 mg | ORAL_CAPSULE | Freq: Two times a day (BID) | ORAL | 0 refills | Status: DC

## 2016-06-22 NOTE — ED Triage Notes (Signed)
Pt says that on Friday she started getting sick with feer, chills, body aches and now she has developed a cough.

## 2016-06-22 NOTE — ED Provider Notes (Signed)
CSN: [id number]     Arrival date & time 06/22/16  9147 History   First MD Initiated Contact with Patient 06/22/16 0825     Chief Complaint  Patient presents with  . Cough   (Consider location/radiation/quality/duration/timing/severity/associated sxs/prior Treatment) HPI  51 year old female who is well-known to this clinic since with a cough, nasal congestion but no fever or chills. Patient has a history of COPD and chronic recurrent bronchitis. She states that she is attempting to stop smoking and is currently listed as smoking a quarter pack a day. Using her inhalers without much success. Coughing  at nighttime interfering with her sleep.      Past Medical History:  Diagnosis Date  . COPD (chronic obstructive pulmonary disease) (HCC)   . Thyroid disease    Past Surgical History:  Procedure Laterality Date  . CESAREAN SECTION    . WRIST GANGLION EXCISION     History reviewed. No pertinent family history. Social History  Substance Use Topics  . Smoking status: Current Every Day Smoker    Packs/day: 0.25  . Smokeless tobacco: Never Used  . Alcohol use No   OB History    Gravida Para Term Preterm AB Living   2 2           SAB TAB Ectopic Multiple Live Births                 Review of Systems  Constitutional: Positive for activity change. Negative for chills, fatigue and fever.  HENT: Positive for congestion, ear pain and postnasal drip.   Respiratory: Positive for cough and shortness of breath. Negative for wheezing and stridor.   All other systems reviewed and are negative.   Allergies  Percocet [oxycodone-acetaminophen]  Home Medications   Prior to Admission medications   Medication Sig Start Date End Date Taking? Authorizing Provider  albuterol (PROVENTIL HFA;VENTOLIN HFA) 108 (90 BASE) MCG/ACT inhaler Inhale into the lungs every 6 (six) hours as needed for wheezing or shortness of breath.   Yes Historical Provider, MD  Fluticasone-Salmeterol (ADVAIR) 100-50  MCG/DOSE AEPB Inhale 1 puff into the lungs 2 (two) times daily.   Yes Historical Provider, MD  levothyroxine (SYNTHROID, LEVOTHROID) 50 MCG tablet Take 75 mcg by mouth daily before breakfast.    Yes Historical Provider, MD  tiotropium (SPIRIVA) 18 MCG inhalation capsule Place 18 mcg into inhaler and inhale daily.   Yes Historical Provider, MD  benzonatate (TESSALON) 200 MG capsule Take one cap TID PRN cough 06/22/16   Lutricia Feil, PA-C  chlorpheniramine-HYDROcodone [facility]) 10-8 MG/5ML SUER Take 5 mLs by mouth 2 (two) times daily. 06/22/16   Lutricia Feil, PA-C  doxycycline (VIBRAMYCIN) 100 MG capsule Take 1 capsule (100 mg total) by mouth 2 (two) times daily. 06/22/16   Lutricia Feil, PA-C   Meds Ordered and Administered this Visit  Medications - No data to display  Ht  (1.651 m)   Wt 111 lb (50.3 kg)   BMI 18.47 kg/m  No data found.   Physical Exam  Constitutional: She is oriented to person, place, and time. She appears well-developed and well-nourished. No distress.  HENT:  Head: Normocephalic and atraumatic.  Left Ear: External ear normal.  Nose: Nose normal.  Mouth/Throat: Oropharynx is clear and moist. No oropharyngeal exudate.  Right TM is dark  Eyes: EOM are normal. Pupils are equal, round, and reactive to light. Right eye exhibits no discharge. Left eye exhibits no discharge.  Neck: Normal range  of motion.  Pulmonary/Chest: No respiratory distress. She has no wheezes. She has no rales.  Patient exhibits a prolonged expiratory phase  Musculoskeletal: Normal range of motion.  Lymphadenopathy:    She has no cervical adenopathy.  Neurological: She is alert and oriented to person, place, and time.  Skin: Skin is warm and dry. She is not diaphoretic.  Psychiatric: She has a normal mood and affect. Her behavior is normal. Judgment and thought content normal.  Nursing note and vitals reviewed.   Urgent Care Course     Procedures (including  critical care time)  Labs Review Labs Reviewed - No data to display  Imaging Review No results found.   Visual Acuity Review  Right Eye Distance:   Left Eye Distance:   Bilateral Distance:    Right Eye Near:   Left Eye Near:    Bilateral Near:         MDM   1. Bronchitis   2. Simple chronic bronchitis (HCC)    Discharge Medication List as of 06/22/2016  8:42 AM    START taking these medications   Details  benzonatate (TESSALON) 200 MG capsule Take one cap TID PRN cough, Normal    chlorpheniramine-HYDROcodone (TUSSIONEX PENNKINETIC ER) 10-8 MG/5ML SUER Take 5 mLs by mouth 2 (two) times daily., Starting Thu 06/22/2016, Print    doxycycline (VIBRAMYCIN) 100 MG capsule Take 1 capsule (100 mg total) by mouth 2 (two) times daily., Starting Thu 06/22/2016, Normal      Plan: 1. Test/x-ray results and diagnosis reviewed with patient 2. rx as per orders; risks, benefits, potential side effects reviewed with patient 3. Recommend supportive treatment with Rest and fluids. Again I have encouraged her to stop smoking. Cause of her COPD we'll start her on a short course of antibiotic. If she is not improving she should follow-up with her primary care physician. 4. F/u prn if symptoms worsen or don't improve     Lutricia Feil, PA-C 06/22/16 [phone]

## 2016-11-30 ENCOUNTER — Ambulatory Visit
Admission: EM | Admit: 2016-11-30 | Discharge: 2016-11-30 | Disposition: A | Discharge status: Home / Self Care | Payer: Self-pay | Attending: Family Medicine | Admitting: Family Medicine

## 2016-11-30 DIAGNOSIS — J441 Chronic obstructive pulmonary disease with (acute) exacerbation: Secondary | ICD-10-CM

## 2016-11-30 LAB — RAPID STREP SCREEN (MED CTR MEBANE ONLY): Streptococcus, Group A Screen (Direct): NEGATIVE

## 2016-11-30 MED ORDER — PREDNISONE 10 MG (21) PO TBPK: ORAL_TABLET | ORAL | 0 refills | Status: DC

## 2016-11-30 MED ORDER — AZITHROMYCIN 500 MG PO TABS: ORAL_TABLET | ORAL | 0 refills | Status: DC

## 2016-11-30 NOTE — ED Triage Notes (Signed)
Pt reports she has had cough productive of clear phlegm. Throat is burning. Sx started on Monday. Pain 8/10

## 2016-11-30 NOTE — ED Provider Notes (Signed)
[name]    CSN: [id number] Arrival date & time: 11/30/16  1714     History   Chief Complaint Chief Complaint  Patient presents with  . Cough    HPI April Sweeney is a 51 y.o. female.   Patient is a 51 year old white female who's been coughing and chest congestion since Monday. She reports throat been burning and felt raw and irritated. She has a history of COPD she's been using her inhalers. She'll enlist Advair and Spiriva but she also has albuterol inhaler that she's been using as well. States usually she has to get antibiotics she doesn't usually take prednisone when this occurs. She does state that she had C prednisone which was the hospital for about 2 weeks because of pneumonia. She has had a C-section ganglion cyst removal. She is allergic to Percocet she still smokes. No pertinent family medical history relevant to today's visit she does have a history of thyroid disease as well as a COPD   The history is provided by the patient. No language interpreter was used.  Cough  Cough characteristics:  Productive Sputum characteristics:  Green Severity:  Moderate Onset quality:  Sudden Duration:  4 days Progression:  Worsening Chronicity:  Recurrent Smoker: yes   Context: upper respiratory infection   Relieved by:  Nothing Associated symptoms: shortness of breath and wheezing     Past Medical History:  Diagnosis Date  . COPD (chronic obstructive pulmonary disease) (HCC)   . Thyroid disease     There are no active problems to display for this patient.   Past Surgical History:  Procedure Laterality Date  . CESAREAN SECTION    . WRIST GANGLION EXCISION      OB History    Gravida Para Term Preterm AB Living   2 2           SAB TAB Ectopic Multiple Live Births                   Home Medications    Prior to Admission medications   Medication Sig Start Date End Date Taking? Authorizing Provider  albuterol (PROVENTIL HFA;VENTOLIN HFA) 108 (90  BASE) MCG/ACT inhaler Inhale into the lungs every 6 (six) hours as needed for wheezing or shortness of breath.    [provider]  azithromycin (ZITHROMAX) 500 MG tablet 1 tablet po daily 11/30/16   Hassan Rowan, MD  benzonatate (TESSALON) 200 MG capsule Take one cap TID PRN cough 06/22/16   Lutricia Feil, PA-C  chlorpheniramine-HYDROcodone [facility]) 10-8 MG/5ML SUER Take 5 mLs by mouth 2 (two) times daily. 06/22/16   Lutricia Feil, PA-C  doxycycline (VIBRAMYCIN) 100 MG capsule Take 1 capsule (100 mg total) by mouth 2 (two) times daily. 06/22/16   Lutricia Feil, PA-C  Fluticasone-Salmeterol (ADVAIR) 100-50 MCG/DOSE AEPB Inhale 1 puff into the lungs 2 (two) times daily.    [provider]  levothyroxine (SYNTHROID, LEVOTHROID) 50 MCG tablet Take 75 mcg by mouth daily before breakfast.     [provider]  predniSONE (STERAPRED UNI-PAK 21 TAB) 10 MG (21) TBPK tablet Sig 6 tablet day 1, 5 tablets day 2, 4 tablets day 3,,3tablets day 4, 2 tablets day 5, 1 tablet day 6 take all tablets orally 11/30/16   Hassan Rowan, MD  tiotropium (SPIRIVA) 18 MCG inhalation capsule Place 18 mcg into inhaler and inhale daily.    [provider]    Family History History reviewed. No pertinent  family history.  Social History Social History  Substance Use Topics  . Smoking status: Current Every Day Smoker    Packs/day: 0.25    Types: Cigarettes  . Smokeless tobacco: Never Used  . Alcohol use No     Allergies   Percocet [oxycodone-acetaminophen]   Review of Systems Review of Systems  Respiratory: Positive for cough, shortness of breath and wheezing.   All other systems reviewed and are negative.    Physical Exam Triage Vital Signs ED Triage Vitals  Enc Vitals Group     BP 11/30/16 1724 131/75     Pulse Rate 11/30/16 1724 84     Resp 11/30/16 1724 18     Temp 11/30/16 1724 98.2 F (36.8 C)     Temp Source 11/30/16 1724 Oral     SpO2  11/30/16 1724 96 %     Weight 11/30/16 1721 112 lb (50.8 kg)     Height 11/30/16 1721  (1.676 m)     Head Circumference --      Peak Flow --      Pain Score 11/30/16 1722 8     Pain Loc --      Pain Edu? --      Excl. in GC? --    No data found.   Updated Vital Signs BP 131/75 (BP Location: Left Arm)   Pulse 84   Temp 98.2 F (36.8 C) (Oral)   Resp 18   Ht  (1.676 m)   Wt 112 lb (50.8 kg)   LMP 02/17/2014   SpO2 96%   BMI 18.08 kg/m   Visual Acuity Right Eye Distance:   Left Eye Distance:   Bilateral Distance:    Right Eye Near:   Left Eye Near:    Bilateral Near:     Physical Exam  Constitutional: She is oriented to person, place, and time. She appears well-developed. She appears ill.  HENT:  Head: Normocephalic and atraumatic.  Right Ear: External ear normal.  Left Ear: External ear normal.  Mouth/Throat: Oropharynx is clear and moist.  Eyes: Pupils are equal, round, and reactive to light. Conjunctivae are normal.  Neck: Normal range of motion. Neck supple.  Cardiovascular: Normal rate and regular rhythm.  Exam reveals distant heart sounds.   Pulmonary/Chest: Effort normal. She has decreased breath sounds. She has wheezes.  Abdominal: Soft.  Musculoskeletal: Normal range of motion.  Lymphadenopathy:    She has no cervical adenopathy.  Neurological: She is alert and oriented to person, place, and time.  Skin: Skin is warm.  Psychiatric: She has a normal mood and affect.  Vitals reviewed.    UC Treatments / Results  Labs (all labs ordered are listed, but only abnormal results are displayed) Labs Reviewed  RAPID STREP SCREEN (NOT AT [facility])  CULTURE, GROUP A STREP [facility])    EKG  EKG Interpretation None       Radiology No results found.  Procedures Procedures (including critical care time)  Medications Ordered in UC Medications - No data to display   Initial Impression / Assessment and Plan / UC Course  I have reviewed the  triage vital signs and the nursing notes.  Pertinent labs & imaging results that were available during my care of the patient were reviewed by me and considered in my medical decision making (see chart for details).    strongly recommend patient stop smoking. We'll place on Zithromax for dose [employer] but she states she has at home  already in place on his 6 day course of prednisone as well. She declined work note. Also offered aerosol treatment patient declined that as well    Final Clinical Impressions(s) / UC Diagnoses   Final diagnoses:  COPD with acute exacerbation [facility])   Note: This dictation was prepared with Dragon dictation along with smaller phrase technology. Any transcriptional errors that result from this process are unintentional.  New Prescriptions New Prescriptions   AZITHROMYCIN (ZITHROMAX) 500 MG TABLET    1 tablet po daily   PREDNISONE (STERAPRED UNI-PAK 21 TAB) 10 MG (21) TBPK TABLET    Sig 6 tablet day 1, 5 tablets day 2, 4 tablets day 3,,3tablets day 4, 2 tablets day 5, 1 tablet day 6 take all tablets orally     Controlled Substance Prescriptions [place] Controlled Substance Registry consulted? Not Applicable   Hassan Rowan, MD 11/30/16 [phone]

## 2016-12-03 LAB — CULTURE, GROUP A STREP (THRC)

## 2017-05-26 ENCOUNTER — Emergency Department: Payer: Self-pay

## 2017-05-26 ENCOUNTER — Emergency Department
Admission: EM | Admit: 2017-05-26 | Discharge: 2017-05-26 | Disposition: A | Discharge status: Home / Self Care | Payer: Self-pay | Attending: Emergency Medicine | Admitting: Emergency Medicine

## 2017-05-26 ENCOUNTER — Encounter: Payer: Self-pay | Admitting: *Deleted

## 2017-05-26 ENCOUNTER — Other Ambulatory Visit: Payer: Self-pay

## 2017-05-26 DIAGNOSIS — Z5321 Procedure and treatment not carried out due to patient leaving prior to being seen by health care provider: Secondary | ICD-10-CM | POA: Insufficient documentation

## 2017-05-26 DIAGNOSIS — R079 Chest pain, unspecified: Secondary | ICD-10-CM | POA: Insufficient documentation

## 2017-05-26 LAB — CBC
HCT: 42.3 % (ref 35.0–47.0)
Hemoglobin: 14.3 g/dL (ref 12.0–16.0)
MCH: 32.6 pg (ref 26.0–34.0)
MCHC: 33.9 g/dL (ref 32.0–36.0)
MCV: 96.2 fL (ref 80.0–100.0)
PLATELETS: 225 10*3/uL (ref 150–440)
RBC: 4.4 MIL/uL (ref 3.80–5.20)
RDW: 13.2 % (ref 11.5–14.5)
WBC: 8.3 10*3/uL (ref 3.6–11.0)

## 2017-05-26 LAB — BASIC METABOLIC PANEL
Anion gap: 8 (ref 5–15)
BUN: 12 mg/dL (ref 6–20)
CALCIUM: 9.4 mg/dL (ref 8.9–10.3)
CO2: 27 mmol/L (ref 22–32)
CREATININE: 0.69 mg/dL (ref 0.44–1.00)
Chloride: 106 mmol/L (ref 101–111)
GFR calc non Af Amer: >60 mL/min (ref >60)
Glucose, Bld: 80 mg/dL (ref 65–99)
Potassium: 4.6 mmol/L (ref 3.5–5.1)
SODIUM: 141 mmol/L (ref 135–145)

## 2017-05-26 LAB — TROPONIN I

## 2017-05-26 NOTE — ED Triage Notes (Signed)
Pt to Ed reporting chest pain since last Thursday that has started to radiate into the left side of back and new onset of intermittent numbness in left hand. Pt reports intermittent episodes of dizziness. No NVD, no cough, no fevers and no neck or jaw pain reported. Pt denies a heart hx.

## 2017-05-26 NOTE — ED Notes (Signed)
Patient to stat desk asking about wait time. Patient given update on wait time. Patient in no distress at this time.

## 2017-05-27 ENCOUNTER — Encounter: Payer: Self-pay | Admitting: Gynecology

## 2017-05-27 ENCOUNTER — Ambulatory Visit
Admission: EM | Admit: 2017-05-27 | Discharge: 2017-05-27 | Disposition: A | Discharge status: Home / Self Care | Payer: Self-pay | Attending: Family Medicine | Admitting: Family Medicine

## 2017-05-27 ENCOUNTER — Other Ambulatory Visit: Payer: Self-pay

## 2017-05-27 DIAGNOSIS — M940 Chondrocostal junction syndrome [Tietze]: Secondary | ICD-10-CM

## 2017-05-27 NOTE — ED Provider Notes (Signed)
[name]    CSN: [id number] Arrival date & time: 05/27/17  1336     History   Chief Complaint Chief Complaint  Patient presents with  . lab result    HPI April Sweeney is a 52 y.o. female.   52 yo female with a h/o COPD here "for lab results from the ER last night". Patient states that she went to [facility] ED last night for chest pain, had chest x-ray, EKG and labs done, however patient states that she left without being seen after having to wait for 7 hours. States she chest pain has improved and now does not feel it except if she presses (palpates) on her chest muscles. Denies any shortness of breath, fevers, chills, neck/jaw pain, left arm pain.   The history is provided by the patient.    Past Medical History:  Diagnosis Date  . COPD (chronic obstructive pulmonary disease) (HCC)   . Thyroid disease     There are no active problems to display for this patient.   Past Surgical History:  Procedure Laterality Date  . CESAREAN SECTION    . WRIST GANGLION EXCISION      OB History    Gravida  2   Para  2   Term      Preterm      AB      Living        SAB      TAB      Ectopic      Multiple      Live Births               Home Medications    Prior to Admission medications   Medication Sig Start Date End Date Taking? Authorizing Provider  albuterol (PROVENTIL HFA;VENTOLIN HFA) 108 (90 BASE) MCG/ACT inhaler Inhale into the lungs every 6 (six) hours as needed for wheezing or shortness of breath.   Yes [provider]  Fluticasone-Salmeterol (ADVAIR) 100-50 MCG/DOSE AEPB Inhale 1 puff into the lungs 2 (two) times daily.   Yes [provider]  levothyroxine (SYNTHROID, LEVOTHROID) 50 MCG tablet Take 75 mcg by mouth daily before breakfast.    Yes [provider]  tiotropium (SPIRIVA) 18 MCG inhalation capsule Place 18 mcg into inhaler and inhale daily.   Yes [provider]  azithromycin (ZITHROMAX) 500  MG tablet 1 tablet po daily 11/30/16   Hassan Rowan, MD  benzonatate (TESSALON) 200 MG capsule Take one cap TID PRN cough 06/22/16   Lutricia Feil, PA-C  chlorpheniramine-HYDROcodone [facility]) 10-8 MG/5ML SUER Take 5 mLs by mouth 2 (two) times daily. 06/22/16   Lutricia Feil, PA-C  doxycycline (VIBRAMYCIN) 100 MG capsule Take 1 capsule (100 mg total) by mouth 2 (two) times daily. 06/22/16   Lutricia Feil, PA-C  predniSONE (STERAPRED UNI-PAK 21 TAB) 10 MG (21) TBPK tablet Sig 6 tablet day 1, 5 tablets day 2, 4 tablets day 3,,3tablets day 4, 2 tablets day 5, 1 tablet day 6 take all tablets orally 11/30/16   Hassan Rowan, MD    Family History Family History  Problem Relation Age of Onset  . Dementia Mother   . Cancer Father   . Liver cancer Father     Social History Social History   Tobacco Use  . Smoking status: Current Every Day Smoker    Packs/day: 0.25    Types: Cigarettes  . Smokeless tobacco: Never Used  Substance Use Topics  .  Alcohol use: No  . Drug use: Not on file     Allergies   Percocet [oxycodone-acetaminophen]   Review of Systems Review of Systems   Physical Exam Triage Vital Signs ED Triage Vitals [05/27/17 1358]  Enc Vitals Group     BP      Pulse      Resp      Temp      Temp src      SpO2      Weight 112 lb (50.8 kg)     Height      Head Circumference      Peak Flow      Pain Score 1     Pain Loc      Pain Edu?      Excl. in GC?    No data found.  Updated Vital Signs BP 136/68 (BP Location: Left Arm)   Pulse 72   Temp 98.4 F (36.9 C) (Oral)   Resp 16   Wt 112 lb (50.8 kg)   LMP 02/17/2014   SpO2 100%   BMI 18.64 kg/m   Visual Acuity Right Eye Distance:   Left Eye Distance:   Bilateral Distance:    Right Eye Near:   Left Eye Near:    Bilateral Near:     Physical Exam  Constitutional: She appears well-developed and well-nourished. No distress.  Cardiovascular: Normal rate, regular rhythm and normal  heart sounds.  Pulmonary/Chest: Effort normal and breath sounds normal. No stridor. No respiratory distress. She has no wheezes. She has no rales. She exhibits tenderness (left upper chest wall; reproducible ).  Skin: She is not diaphoretic.  Nursing note and vitals reviewed.    UC Treatments / Results  Labs (all labs ordered are listed, but only abnormal results are displayed) Labs Reviewed - No data to display  EKG None Radiology Dg Chest 2 View  Result Date: 05/26/2017 CLINICAL DATA:  Chest pain 1 week radiating to left side of back with numbness left hand. Intermittent dizziness. EXAM: CHEST - 2 VIEW COMPARISON:  02/17/2012 and chest CT 02/19/2012 FINDINGS: Lungs are hyperexpanded with evidence of emphysematous disease. There is no lobar consolidation or effusion. Subtle prominence of the perihilar markings suggesting mild vascular congestion. Nipples project over the mid lungs unchanged. Cardiomediastinal silhouette and remainder the exam is unchanged. IMPRESSION: Suggestion of minimal vascular congestion. Emphysematous disease. Electronically Signed   By: Elberta Fortisaniel  Boyle M.D.   On: 05/26/2017 17:21    Procedures Procedures (including critical care time)  Medications Ordered in UC Medications - No data to display   Initial Impression / Assessment and Plan / UC Course  I have reviewed the triage vital signs and the nursing notes.  Pertinent labs & imaging results that were available during my care of the patient were reviewed by me and considered in my medical decision making (see chart for details).       Final Clinical Impressions(s) / UC Diagnoses   Final diagnoses:  Costochondritis    ED Discharge Orders    None     1. Labs/x-ray results from ED last night were reviewed with patient; diagnosis reviewed with patient. 2. Recommend supportive treatment with rest, ice, otc analgesics prn 3. Follow-up prn if symptoms worsen or don't improve   Controlled Substance  Prescriptions [place] Controlled Substance Registry consulted? Not Applicable   [name], Juanita Streight, MD 05/27/17 [phone]

## 2017-05-27 NOTE — Discharge Instructions (Signed)
Tylenol as needed.

## 2017-05-27 NOTE — ED Triage Notes (Signed)
Per patient was seen at the ER  yesterday for chest pain. Per patient was waiting for 7 hrs and left. Per patient had 2 xray done and an EKG and blood work. Patient would like the result of her xray and blood work.

## 2017-05-29 ENCOUNTER — Ambulatory Visit
Admission: EM | Admit: 2017-05-29 | Discharge: 2017-05-29 | Disposition: A | Discharge status: Home / Self Care | Payer: Self-pay | Attending: Family Medicine | Admitting: Family Medicine

## 2017-05-29 ENCOUNTER — Other Ambulatory Visit: Payer: Self-pay

## 2017-05-29 DIAGNOSIS — J441 Chronic obstructive pulmonary disease with (acute) exacerbation: Secondary | ICD-10-CM

## 2017-05-29 MED ORDER — PREDNISONE 20 MG PO TABS: 20.0000 mg | ORAL_TABLET | Freq: Every day | ORAL | 0 refills | Status: DC

## 2017-05-29 MED ORDER — DOXYCYCLINE HYCLATE 100 MG PO TABS: 100.0000 mg | ORAL_TABLET | Freq: Two times a day (BID) | ORAL | 0 refills | Status: DC

## 2017-05-29 NOTE — ED Provider Notes (Signed)
[name]    CSN: [id number] Arrival date & time: 05/29/17  1111     History   Chief Complaint Chief Complaint  Patient presents with  . Cough    HPI April Sweeney is a 52 y.o. female.   The history is provided by the patient.  Cough  Cough characteristics:  Productive Sputum characteristics:  Yellow Severity:  Moderate Onset quality:  Sudden Duration:  1 week Timing:  Constant Progression:  Worsening Chronicity:  Chronic Smoker: yes   Context: smoke exposure and upper respiratory infection   Relieved by:  Nothing Ineffective treatments:  Beta-agonist inhaler Associated symptoms: wheezing     Past Medical History:  Diagnosis Date  . COPD (chronic obstructive pulmonary disease) (HCC)   . Thyroid disease     There are no active problems to display for this patient.   Past Surgical History:  Procedure Laterality Date  . CESAREAN SECTION    . WRIST GANGLION EXCISION      OB History    Gravida  2   Para  2   Term      Preterm      AB      Living        SAB      TAB      Ectopic      Multiple      Live Births               Home Medications    Prior to Admission medications   Medication Sig Start Date End Date Taking? Authorizing Provider  albuterol (PROVENTIL HFA;VENTOLIN HFA) 108 (90 BASE) MCG/ACT inhaler Inhale into the lungs every 6 (six) hours as needed for wheezing or shortness of breath.   Yes [provider]  Fluticasone-Salmeterol (ADVAIR) 100-50 MCG/DOSE AEPB Inhale 1 puff into the lungs 2 (two) times daily.   Yes [provider]  levothyroxine (SYNTHROID, LEVOTHROID) 50 MCG tablet Take 75 mcg by mouth daily before breakfast.    Yes [provider]  tiotropium (SPIRIVA) 18 MCG inhalation capsule Place 18 mcg into inhaler and inhale daily.   Yes [provider]  doxycycline (VIBRA-TABS) 100 MG tablet Take 1 tablet (100 mg total) by mouth 2 (two) times daily. 05/29/17   Payton Mccallum, MD  predniSONE (DELTASONE) 20 MG tablet Take 1 tablet (20 mg total) by mouth daily. 05/29/17   Payton Mccallum, MD    Family History Family History  Problem Relation Age of Onset  . Dementia Mother   . Cancer Father   . Liver cancer Father     Social History Social History   Tobacco Use  . Smoking status: Current Every Day Smoker    Packs/day: 0.25    Types: Cigarettes  . Smokeless tobacco: Never Used  Substance Use Topics  . Alcohol use: No  . Drug use: Never     Allergies   Percocet [oxycodone-acetaminophen]   Review of Systems Review of Systems  Respiratory: Positive for cough and wheezing.      Physical Exam Triage Vital Signs ED Triage Vitals  Enc Vitals Group     BP 05/29/17 1124 101/62     Pulse Rate 05/29/17 1124 94     Resp 05/29/17 1124 18     Temp 05/29/17 1124 98.3 F (36.8 C)     Temp Source 05/29/17 1124 Oral     SpO2 05/29/17 1124 100 %     Weight 05/29/17 1121 112 lb (50.8 kg)  Height 05/29/17 1121 5\' 5"  (1.651 m)     Head Circumference --      Peak Flow --      Pain Score 05/29/17 1121 5     Pain Loc --      Pain Edu? --      Excl. in GC? --    No data found.  Updated Vital Signs BP 101/62 (BP Location: Right Arm)   Pulse 94   Temp 98.3 F (36.8 C) (Oral)   Resp 18   Ht 5\' 5"  (1.651 m)   Wt 112 lb (50.8 kg)   LMP 02/17/2014   SpO2 100%   BMI 18.64 kg/m   Visual Acuity Right Eye Distance:   Left Eye Distance:   Bilateral Distance:    Right Eye Near:   Left Eye Near:    Bilateral Near:     Physical Exam  Constitutional: She appears well-developed and well-nourished. No distress.  HENT:  Head: Normocephalic and atraumatic.  Right Ear: Tympanic membrane, external ear and ear canal normal.  Left Ear: Tympanic membrane, external ear and ear canal normal.  Nose: No mucosal edema, rhinorrhea, nose lacerations, sinus tenderness, nasal deformity, septal deviation or nasal septal hematoma. No epistaxis.  No foreign  bodies. Right sinus exhibits no maxillary sinus tenderness and no frontal sinus tenderness. Left sinus exhibits no maxillary sinus tenderness and no frontal sinus tenderness.  Mouth/Throat: Uvula is midline, oropharynx is clear and moist and mucous membranes are normal. No oropharyngeal exudate.  Eyes: Pupils are equal, round, and reactive to light. Conjunctivae and EOM are normal. Right eye exhibits no discharge. Left eye exhibits no discharge. No scleral icterus.  Neck: Normal range of motion. Neck supple. No thyromegaly present.  Cardiovascular: Normal rate, regular rhythm and normal heart sounds.  Pulmonary/Chest: Effort normal. No stridor. No respiratory distress. She has wheezes (few, mild and rhonchi). She has no rales.  Lymphadenopathy:    She has no cervical adenopathy.  Skin: She is not diaphoretic.  Nursing note and vitals reviewed.    UC Treatments / Results  Labs (all labs ordered are listed, but only abnormal results are displayed) Labs Reviewed - No data to display  EKG None Radiology No results found.  Procedures Procedures (including critical care time)  Medications Ordered in UC Medications - No data to display   Initial Impression / Assessment and Plan / UC Course  I have reviewed the triage vital signs and the nursing notes.  Pertinent labs & imaging results that were available during my care of the patient were reviewed by me and considered in my medical decision making (see chart for details).       Final Clinical Impressions(s) / UC Diagnoses   Final diagnoses:  COPD exacerbation [facility](HCC)    ED Discharge Orders        Ordered    doxycycline (VIBRA-TABS) 100 MG tablet  2 times daily     05/29/17 1158    predniSONE (DELTASONE) 20 MG tablet  Daily     05/29/17 1158     1. diagnosis reviewed with patient 2. rx as per orders above; reviewed possible side effects, interactions, risks and benefits  3. Recommend continue home inhalers 4. Follow-up  prn if symptoms worsen or don't improve Controlled Substance Prescriptions [place] Controlled Substance Registry consulted? Not Applicable   [name], Bayla Mcgovern, MD 05/29/17 1217

## 2017-05-29 NOTE — ED Triage Notes (Signed)
Patient complains of sore throat, cough, congestion. Patient states that symptoms started yesterday.

## 2017-06-09 ENCOUNTER — Ambulatory Visit
Admission: EM | Admit: 2017-06-09 | Discharge: 2017-06-09 | Disposition: A | Discharge status: Home / Self Care | Payer: Self-pay | Attending: Emergency Medicine | Admitting: Emergency Medicine

## 2017-06-09 ENCOUNTER — Other Ambulatory Visit: Payer: Self-pay

## 2017-06-09 DIAGNOSIS — J441 Chronic obstructive pulmonary disease with (acute) exacerbation: Secondary | ICD-10-CM

## 2017-06-09 MED ORDER — AEROCHAMBER PLUS MISC: 2 refills | Status: DC

## 2017-06-09 MED ORDER — AMOXICILLIN-POT CLAVULANATE 875-125 MG PO TABS: 1.0000 | ORAL_TABLET | Freq: Two times a day (BID) | ORAL | 0 refills | Status: AC

## 2017-06-09 NOTE — ED Triage Notes (Signed)
Patient was seen 06/04/17 for influenza and copd. Patient is still experiencing coughing and congestion. Patient states she finished the prednisone and doxycycline but is not feeling better.She is also c/o burning pain in her back.

## 2017-06-09 NOTE — Discharge Instructions (Addendum)
2 puffs from your albuterol inhaler every 4-6 hours using your spacer for the next several days, finish the Augmentin, follow-up with Dr. Lin GivensSavage in several days, and go to the ER if you get worse.

## 2017-06-09 NOTE — ED Provider Notes (Signed)
HPI  SUBJECTIVE:  April Sweeney is a 52 y.o. female who presents with chest congestion and a productive cough for the past week to 10 days.  States that the cough is cough productive of greenish sputum.  She reports an increase in the amount than her baseline and states that it is different color than normal.  She reports wheezing yesterday and states that she was satting 91% per her home O2 monitor yesterday.  She reports chest pain described as soreness secondary to the cough.  States that she required her albuterol inhaler 3 times yesterday when she was short of breath.  She states that she normally uses it as needed.  Denies fevers, shortness of breath today, dyspnea on exertion, nasal congestion, postnasal drip, sinus pain or pressure. Seen here on 3/26 for cough, thought to have a COPD exacerbation, sent home on doxycycline and prednisone.  Then seen in the [facility] ED on 3/30 and 4/1.  She was found to have influenza A and treated for another COPD exacerbation.  She was observed overnight for COPD exacerbation due to hypoxemia in the 80s on room air.  She was given oral steroids, aggressive bronchodilators and released home.  She was given an additional 7 days of steroids, instructed to complete the doxycycline and refilled albuterol.  Patient states that she was told to stop the steroids, so she was on it for 5 days total.  She states that she finished the doxycycline.  Has Not tried anything other than the albuterol for symptoms.  There are no other aggravating or alleviating factors.  She has a past medical history of COPD and states that she is compliant with her Spiriva, Advair.  She states that she quit smoking 2 weeks ago.  Also history of hypothyroidism.  No history of diabetes.  PMD: Dr[facility] Lin [facility]Savage at [facility].  Past Medical History:  Diagnosis Date  . COPD (chronic obstructive pulmonary disease) (HCC)   . Thyroid disease     Past Surgical History:  Procedure Laterality Date  .  CESAREAN SECTION    . WRIST GANGLION EXCISION      Family History  Problem Relation Age of Onset  . Dementia Mother   . Cancer Father   . Liver cancer Father     Social History   Tobacco Use  . Smoking status: Current Every Day Smoker    Packs/day: 0.25    Types: Cigarettes  . Smokeless tobacco: Never Used  Substance Use Topics  . Alcohol use: No  . Drug use: Never    No current facility-administered medications for this encounter.   Current Outpatient Medications:  .  albuterol (PROVENTIL HFA;VENTOLIN HFA) 108 (90 BASE) MCG/ACT inhaler, Inhale into the lungs every 6 (six) hours as needed for wheezing or shortness of breath., Disp: , Rfl:  .  Fluticasone-Salmeterol (ADVAIR) 100-50 MCG/DOSE AEPB, Inhale 1 puff into the lungs 2 (two) times daily., Disp: , Rfl:  .  levothyroxine (SYNTHROID, LEVOTHROID) 50 MCG tablet, Take 75 mcg by mouth daily before breakfast. , Disp: , Rfl:  .  tiotropium (SPIRIVA) 18 MCG inhalation capsule, Place 18 mcg into inhaler and inhale daily., Disp: , Rfl:  .  amoxicillin-clavulanate (AUGMENTIN) 875-125 MG tablet, Take 1 tablet by mouth 2 (two) times daily for 7 days., Disp: 14 tablet, Rfl: 0 .  Spacer/Aero-Holding Chambers (AEROCHAMBER PLUS) inhaler, Use as instructed, Disp: 1 each, Rfl: 2  Allergies  Allergen Reactions  . Percocet [Oxycodone-Acetaminophen] Hives     ROS  As noted in HPI.   Physical Exam  BP 132/82 (BP Location: Left Arm)   Pulse 71   Temp 98.3 F (36.8 C) (Oral)   Ht 5\' 5"  (1.651 m)   Wt 112 lb (50.8 kg)   LMP 02/17/2014   SpO2 98%   BMI 18.64 kg/m   Constitutional: Well developed, well nourished, no acute distress Eyes:  EOMI, conjunctiva normal bilaterally HENT: Normocephalic, atraumatic,mucus membranes moist.  No nasal congestion.  No postnasal drip. Respiratory: Normal inspiratory effort fair air movement, lungs clear bilaterally.  No chest wall tenderness Cardiovascular: Normal rate regular rhythm no murmurs,  rubs, gallops GI: nondistended skin: No rash, skin intact Musculoskeletal: no deformities Neurologic: Alert & oriented x 3, no focal neuro deficits Psychiatric: Speech and behavior appropriate   ED Course   Medications - No data to display  No orders of the defined types were placed in this encounter.   No results found for this or any previous visit (from the past 24 hour(s)). No results found.  ED Clinical Impression  COPD exacerbation [facility])   ED Assessment/Plan  Outside records and previous records from this facility reviewed.  As noted in HPI.  Patient with a persistent COPD exacerbation.  Satting well on room air today has no dyspnea and her lungs are clear.  She declined steroids and a chest x-ray today.  No evidence of sinusitis.  states that Augmentin usually works well for her.  We will try this for 7 days..  We will send home with a spacer as well.  2 puffs from an albuterol inhaler every 4-6 hours using her spacer for the next several days, finish the Augmentin, follow-up with Dr. Lin Givens in several days, to the ER if she gets worse.  Discussed MDM, treatment plan, and plan for follow-up with patient. Discussed sn/sx that should prompt return to the ED. patient agrees with plan.   Meds ordered this encounter  Medications  . Spacer/Aero-Holding Chambers (AEROCHAMBER PLUS) inhaler    Sig: Use as instructed    Dispense:  1 each    Refill:  2  . amoxicillin-clavulanate (AUGMENTIN) 875-125 MG tablet    Sig: Take 1 tablet by mouth 2 (two) times daily for 7 days.    Dispense:  14 tablet    Refill:  0    *This clinic note was created using Scientist, clinical (histocompatibility and immunogenetics). Therefore, there may be occasional mistakes despite careful proofreading.   ?   Domenick Gong, MD 06/09/17 [phone]

## 2017-08-09 ENCOUNTER — Telehealth: Payer: Self-pay | Admitting: *Deleted

## 2017-08-09 NOTE — Telephone Encounter (Signed)
Left pt VM to contact Christy B so we can get her scheduled for the [facility] program with qualifications. Did contact the second number listed also, but was informed it did not belong to her.

## 2017-10-17 ENCOUNTER — Encounter: Payer: Self-pay | Admitting: *Deleted

## 2017-10-17 ENCOUNTER — Other Ambulatory Visit: Payer: Self-pay | Admitting: *Deleted

## 2017-10-17 ENCOUNTER — Ambulatory Visit: Payer: Self-pay | Attending: Oncology | Admitting: *Deleted

## 2017-10-17 ENCOUNTER — Ambulatory Visit
Admission: RE | Admit: 2017-10-17 | Discharge: 2017-10-17 | Disposition: A | Discharge status: Home / Self Care | Payer: Self-pay | Source: Ambulatory Visit | Attending: Oncology | Admitting: Oncology

## 2017-10-17 VITALS — BP 108/64 | HR 87 | Temp 99.0°F | Resp 12 | Ht 65.0 in | Wt 103.0 lb

## 2017-10-17 DIAGNOSIS — N63 Unspecified lump in unspecified breast: Secondary | ICD-10-CM

## 2017-10-17 DIAGNOSIS — Z0000 Encounter for general adult medical examination without abnormal findings: Secondary | ICD-10-CM | POA: Insufficient documentation

## 2017-10-17 NOTE — Patient Instructions (Signed)
Gave patient hand-out, Women Staying Healthy, Active and Well from [facility], with education on breast health, pap smears, heart and colon health. 

## 2017-10-17 NOTE — Progress Notes (Addendum)
  Subjective:     Patient ID: April Sweeney, female   DOB: 31-Mar-1965, 52 y.o.   MRN: [id number]  HPI   Review of Systems     Objective:   Physical Exam  Pulmonary/Chest: Right breast exhibits no inverted nipple, no mass, no nipple discharge, no skin change and no tenderness. Left breast exhibits no inverted nipple, no mass, no nipple discharge, no skin change and no tenderness.       Assessment:     52 year old White female referred to [facility] by [facility] for clinical breast exam and mammogram.  Patient is very thin with minimal to no breast tissue on clinical breast exam.  There is no dominant mass, skin changes, nipple discharge or lymphadenopathy.  I am not 100% sure a mammogram is even possible.  Taught self breast awareness.  Last pap on 09/21/15 was negative without HPV co-testing.  Informed next pap due in 2020.  Patient has been screened for eligibility.  She does not have any insurance, Medicare or Medicaid.  She also meets financial eligibility.  Hand-out given on the Affordable Care Act. Risk Assessment    Risk Scores      10/17/2017   Last edited by: Vanita PandaMiceli, Loretta T, RN   5-year risk: 1.4 %   Lifetime risk: 11.2 %            Plan:     Screening mammogram ordered.  Will follow-up per [facility] protocol.

## 2017-10-25 ENCOUNTER — Ambulatory Visit
Admission: RE | Admit: 2017-10-25 | Discharge: 2017-10-25 | Disposition: A | Discharge status: Home / Self Care | Payer: Self-pay | Source: Ambulatory Visit | Attending: Oncology | Admitting: Oncology

## 2017-10-25 ENCOUNTER — Encounter: Payer: Self-pay | Admitting: *Deleted

## 2017-10-25 DIAGNOSIS — N63 Unspecified lump in unspecified breast: Secondary | ICD-10-CM | POA: Insufficient documentation

## 2017-10-25 NOTE — Progress Notes (Signed)
Letter mailed from the [facility] to inform patient of her normal mammogram results.  Patient is to follow-up with annual screening in one year.  HSIS to Christy. 

## 2017-11-11 ENCOUNTER — Ambulatory Visit
Admission: EM | Admit: 2017-11-11 | Discharge: 2017-11-11 | Disposition: A | Discharge status: Home / Self Care | Payer: Self-pay | Attending: Emergency Medicine | Admitting: Emergency Medicine

## 2017-11-11 DIAGNOSIS — J441 Chronic obstructive pulmonary disease with (acute) exacerbation: Secondary | ICD-10-CM

## 2017-11-11 MED ORDER — PREDNISONE 20 MG PO TABS: ORAL_TABLET | ORAL | 0 refills | Status: DC

## 2017-11-11 MED ORDER — AMOXICILLIN-POT CLAVULANATE 875-125 MG PO TABS: 1.0000 | ORAL_TABLET | Freq: Two times a day (BID) | ORAL | 0 refills | Status: DC

## 2017-11-11 NOTE — ED Triage Notes (Signed)
Pt has a hx of COPD. Has had a cold since Monday with productive cough and wheezing which has kept her up at night. Did take tylenol cold without relief.

## 2017-11-11 NOTE — ED Provider Notes (Addendum)
[name]    CSN: [id number] Arrival date & time: 11/11/17  0804     History   Chief Complaint Chief Complaint  Patient presents with  . Cough    HPI April Sweeney is a 52 y.o. female.   HPI  52 year old female well-known to our clinic presents with cold symptoms that started on Monday has now caused her to have a productive cough of yellow-green sputum and wheezing which kept her up last night.  She has a long history of recurrent chronic COPD exacerbations.  She has been tried over-the-counter medications but have not been effective.  She continues to smoke despite constant appeals to quit smoking.  Has inhalers at home that she is been using in the past has required short course of antibiotics and prednisone in order to abort the exacerbation.       Past Medical History:  Diagnosis Date  . COPD (chronic obstructive pulmonary disease) (HCC)   . Thyroid disease     There are no active problems to display for this patient.   Past Surgical History:  Procedure Laterality Date  . BREAST CYST ASPIRATION Left 1992  . CESAREAN SECTION    . WRIST GANGLION EXCISION      OB History    Gravida  2   Para  2   Term      Preterm      AB      Living        SAB      TAB      Ectopic      Multiple      Live Births               Home Medications    Prior to Admission medications   Medication Sig Start Date End Date Taking? Authorizing Provider  albuterol (PROVENTIL HFA;VENTOLIN HFA) 108 (90 BASE) MCG/ACT inhaler Inhale into the lungs every 6 (six) hours as needed for wheezing or shortness of breath.   Yes [provider]  Fluticasone-Salmeterol (ADVAIR) 100-50 MCG/DOSE AEPB Inhale 1 puff into the lungs 2 (two) times daily.   Yes [provider]  levothyroxine (SYNTHROID, LEVOTHROID) 50 MCG tablet Take 75 mcg by mouth daily before breakfast.    Yes [provider]  Spacer/Aero-Holding Chambers (AEROCHAMBER PLUS)  inhaler Use as instructed 06/09/17  Yes Domenick Gong, MD  tiotropium (SPIRIVA) 18 MCG inhalation capsule Place 18 mcg into inhaler and inhale daily.   Yes [provider]  amoxicillin-clavulanate (AUGMENTIN) 875-125 MG tablet Take 1 tablet by mouth every 12 (twelve) hours. 11/11/17   Lutricia Feil, PA-C  predniSONE (DELTASONE) 20 MG tablet Take 2 tablets (40 mg) daily by mouth 11/11/17   Lutricia Feil, PA-C    Family History Family History  Problem Relation Age of Onset  . Dementia Mother   . Cancer Father   . Liver cancer Father   . Breast cancer Neg Hx     Social History Social History   Tobacco Use  . Smoking status: Current Every Day Smoker    Packs/day: 0.25    Types: Cigarettes  . Smokeless tobacco: Never Used  Substance Use Topics  . Alcohol use: No  . Drug use: Never     Allergies   Percocet [oxycodone-acetaminophen]   Review of Systems Review of Systems  Constitutional: Positive for activity change. Negative for appetite change, chills, fatigue and fever.  HENT: Positive for congestion and rhinorrhea.   Respiratory: Positive for  cough, shortness of breath and wheezing.   All other systems reviewed and are negative.    Physical Exam Triage Vital Signs ED Triage Vitals  Enc Vitals Group     BP 11/11/17 0813 (!) 141/69     Pulse Rate 11/11/17 0813 68     Resp 11/11/17 0813 18     Temp 11/11/17 0813 98.2 F (36.8 C)     Temp Source 11/11/17 0813 Oral     SpO2 11/11/17 0813 95 %     Weight 11/11/17 0815 107 lb (48.5 kg)     Height --      Head Circumference --      Peak Flow --      Pain Score 11/11/17 0815 0     Pain Loc --      Pain Edu? --      Excl. in GC? --    No data found.  Updated Vital Signs BP (!) 141/69 (BP Location: Left Arm)   Pulse 68   Temp 98.2 F (36.8 C) (Oral)   Resp 18   Wt 107 lb (48.5 kg)   LMP 02/17/2014   SpO2 95%   BMI 17.81 kg/m   Visual Acuity Right Eye Distance:   Left Eye Distance:     Bilateral Distance:    Right Eye Near:   Left Eye Near:    Bilateral Near:     Physical Exam  Constitutional: She is oriented to person, place, and time. She appears well-developed and well-nourished. No distress.  HENT:  Head: Normocephalic.  Right Ear: External ear normal.  Left Ear: External ear normal.  Nose: Nose normal.  Mouth/Throat: Oropharynx is clear and moist. No oropharyngeal exudate.  Eyes: Pupils are equal, round, and reactive to light. Right eye exhibits no discharge. Left eye exhibits no discharge.  Neck: Normal range of motion.  Pulmonary/Chest: Effort normal. No stridor. No respiratory distress. She has wheezes. She has no rales. She exhibits no tenderness.  Musculoskeletal: Normal range of motion.  Lymphadenopathy:    She has no cervical adenopathy.  Neurological: She is alert and oriented to person, place, and time.  Skin: Skin is warm and dry. She is not diaphoretic.  Psychiatric: She has a normal mood and affect. Her behavior is normal. Judgment and thought content normal.  Nursing note and vitals reviewed.    UC Treatments / Results  Labs (all labs ordered are listed, but only abnormal results are displayed) Labs Reviewed - No data to display  EKG None  Radiology No results found.  Procedures Procedures (including critical care time)  Medications Ordered in UC Medications - No data to display  Initial Impression / Assessment and Plan / UC Course  I have reviewed the triage vital signs and the nursing notes.  Pertinent labs & imaging results that were available during my care of the patient were reviewed by me and considered in my medical decision making (see chart for details).     Plan: 1. Test/x-ray results and diagnosis reviewed with patient 2. rx as per orders; risks, benefits, potential side effects reviewed with patient 3. Recommend supportive treatment with continued use of his inhalers as necessary for wheezing.  We will treat her  with Augmentin which is worked well for her in the past.  I will give her a short burst of prednisone which also has been beneficial.  High fevers or worsen she should return to our clinic or go the emergency room 4. F/u prn if symptoms  worsen or don't improve  Final Clinical Impressions(s) / UC Diagnoses   Final diagnoses:  COPD exacerbation [facility])   Discharge Instructions   None    ED Prescriptions    Medication Sig Dispense Auth. Provider   amoxicillin-clavulanate (AUGMENTIN) 875-125 MG tablet Take 1 tablet by mouth every 12 (twelve) hours. 14 tablet Ovid Curd P, PA-C   predniSONE (DELTASONE) 20 MG tablet Take 2 tablets (40 mg) daily by mouth 8 tablet Lutricia Feil, PA-C     Controlled Substance Prescriptions [place] Controlled Substance Registry consulted? Not Applicable   Lutricia Feil, PA-C 11/11/17 0835    Lutricia Feil, PA-C 11/11/17 [phone]

## 2017-12-03 ENCOUNTER — Other Ambulatory Visit: Payer: Self-pay

## 2017-12-03 ENCOUNTER — Ambulatory Visit
Admission: EM | Admit: 2017-12-03 | Discharge: 2017-12-03 | Disposition: A | Discharge status: Home / Self Care | Payer: Self-pay | Attending: Family Medicine | Admitting: Family Medicine

## 2017-12-03 ENCOUNTER — Encounter: Payer: Self-pay | Admitting: Emergency Medicine

## 2017-12-03 DIAGNOSIS — F17210 Nicotine dependence, cigarettes, uncomplicated: Secondary | ICD-10-CM

## 2017-12-03 DIAGNOSIS — J441 Chronic obstructive pulmonary disease with (acute) exacerbation: Secondary | ICD-10-CM

## 2017-12-03 MED ORDER — DOXYCYCLINE HYCLATE 100 MG PO TABS: 100.0000 mg | ORAL_TABLET | Freq: Two times a day (BID) | ORAL | 0 refills | Status: DC

## 2017-12-03 MED ORDER — PREDNISONE 20 MG PO TABS: 20.0000 mg | ORAL_TABLET | Freq: Every day | ORAL | 0 refills | Status: DC

## 2017-12-03 MED ORDER — BENZONATATE 200 MG PO CAPS: 200.0000 mg | ORAL_CAPSULE | Freq: Three times a day (TID) | ORAL | 0 refills | Status: DC | PRN

## 2017-12-03 NOTE — ED Triage Notes (Signed)
Patient stated she had the flu shot last week and then stated she started getting cold symptoms. Patient states she has history of COPD and has been having some SOB with this episode.

## 2017-12-03 NOTE — ED Provider Notes (Signed)
[name]    CSN: [id number] Arrival date & time: 12/03/17  1010     History   Chief Complaint Chief Complaint  Patient presents with  . Cough    HPI Cindy TWINKLE SOCKWELL is a 52 y.o. female.   The history is provided by the patient.  Cough  Associated symptoms: wheezing   URI  Presenting symptoms: congestion and cough   Severity:  Moderate Onset quality:  Sudden Duration:  6 days Timing:  Constant Progression:  Worsening Chronicity:  New Relieved by:  Nothing Ineffective treatments:  OTC medications and inhaler Associated symptoms: wheezing   Risk factors: chronic respiratory disease (copd) and sick contacts   Risk factors: not elderly, no chronic cardiac disease, no chronic kidney disease, no diabetes mellitus, no immunosuppression, no recent illness and no recent travel     Past Medical History:  Diagnosis Date  . COPD (chronic obstructive pulmonary disease) (HCC)   . Thyroid disease     There are no active problems to display for this patient.   Past Surgical History:  Procedure Laterality Date  . BREAST CYST ASPIRATION Left 1992  . CESAREAN SECTION    . WRIST GANGLION EXCISION      OB History    Gravida  2   Para  2   Term      Preterm      AB      Living        SAB      TAB      Ectopic      Multiple      Live Births               Home Medications    Prior to Admission medications   Medication Sig Start Date End Date Taking? Authorizing Provider  albuterol (PROVENTIL HFA;VENTOLIN HFA) 108 (90 BASE) MCG/ACT inhaler Inhale into the lungs every 6 (six) hours as needed for wheezing or shortness of breath.   Yes [provider]  Fluticasone-Salmeterol (ADVAIR) 100-50 MCG/DOSE AEPB Inhale 1 puff into the lungs 2 (two) times daily.   Yes [provider]  levothyroxine (SYNTHROID, LEVOTHROID) 50 MCG tablet Take 75 mcg by mouth daily before breakfast.    Yes [provider]  Spacer/Aero-Holding  Chambers (AEROCHAMBER PLUS) inhaler Use as instructed 06/09/17  Yes Domenick Gong, MD  tiotropium (SPIRIVA) 18 MCG inhalation capsule Place 18 mcg into inhaler and inhale daily.   Yes [provider]  amoxicillin-clavulanate (AUGMENTIN) 875-125 MG tablet Take 1 tablet by mouth every 12 (twelve) hours. 11/11/17   Lutricia Feil, PA-C  benzonatate (TESSALON) 200 MG capsule Take 1 capsule (200 mg total) by mouth 3 (three) times daily as needed. 12/03/17   Payton Mccallum, MD  doxycycline (VIBRA-TABS) 100 MG tablet Take 1 tablet (100 mg total) by mouth 2 (two) times daily. 12/03/17   Payton Mccallum, MD  predniSONE (DELTASONE) 20 MG tablet Take 1 tablet (20 mg total) by mouth daily. 12/03/17   Payton Mccallum, MD    Family History Family History  Problem Relation Age of Onset  . Dementia Mother   . Cancer Father   . Liver cancer Father   . Breast cancer Neg Hx     Social History Social History   Tobacco Use  . Smoking status: Current Every Day Smoker    Packs/day: 0.25    Types: Cigarettes  . Smokeless tobacco: Never Used  Substance Use Topics  . Alcohol use: No  .  Drug use: Never     Allergies   Percocet [oxycodone-acetaminophen]   Review of Systems Review of Systems  HENT: Positive for congestion.   Respiratory: Positive for cough and wheezing.      Physical Exam Triage Vital Signs ED Triage Vitals  Enc Vitals Group     BP 12/03/17 1028 (!) 141/84     Pulse Rate 12/03/17 1028 73     Resp 12/03/17 1028 18     Temp 12/03/17 1028 98.2 F (36.8 C)     Temp Source 12/03/17 1028 Oral     SpO2 12/03/17 1028 97 %     Weight 12/03/17 1029 107 lb (48.5 kg)     Height 12/03/17 1029 5\' 6"  (1.676 m)     Head Circumference --      Peak Flow --      Pain Score 12/03/17 1028 0     Pain Loc --      Pain Edu? --      Excl. in GC? --    No data found.  Updated Vital Signs BP (!) 141/84 (BP Location: Left Arm)   Pulse 73   Temp 98.2 F (36.8 C) (Oral)   Resp 18    Ht 5\' 6"  (1.676 m)   Wt 48.5 kg   LMP 02/17/2014   SpO2 97%   BMI 17.27 kg/m   Visual Acuity Right Eye Distance:   Left Eye Distance:   Bilateral Distance:    Right Eye Near:   Left Eye Near:    Bilateral Near:     Physical Exam  Constitutional: She appears well-developed and well-nourished. No distress.  HENT:  Head: Normocephalic and atraumatic.  Right Ear: Tympanic membrane, external ear and ear canal normal.  Left Ear: Tympanic membrane, external ear and ear canal normal.  Nose: Rhinorrhea present. No mucosal edema, nose lacerations, sinus tenderness, nasal deformity, septal deviation or nasal septal hematoma. No epistaxis.  No foreign bodies. Right sinus exhibits no maxillary sinus tenderness and no frontal sinus tenderness. Left sinus exhibits no maxillary sinus tenderness and no frontal sinus tenderness.  Mouth/Throat: Uvula is midline, oropharynx is clear and moist and mucous membranes are normal. No oropharyngeal exudate.  Eyes: Right eye exhibits no discharge. Left eye exhibits no discharge. No scleral icterus.  Neck: Normal range of motion. Neck supple. No thyromegaly present.  Cardiovascular: Normal rate, regular rhythm and normal heart sounds.  Pulmonary/Chest: Effort normal. No stridor. No respiratory distress. She has wheezes (mild diffusely and rhonchi). She has no rales.  Lymphadenopathy:    She has no cervical adenopathy.  Skin: She is not diaphoretic.  Nursing note and vitals reviewed.    UC Treatments / Results  Labs (all labs ordered are listed, but only abnormal results are displayed) Labs Reviewed - No data to display  EKG None  Radiology No results found.  Procedures Procedures (including critical care time)  Medications Ordered in UC Medications - No data to display  Initial Impression / Assessment and Plan / UC Course  I have reviewed the triage vital signs and the nursing notes.  Pertinent labs & imaging results that were available  during my care of the patient were reviewed by me and considered in my medical decision making (see chart for details).      Final Clinical Impressions(s) / UC Diagnoses   Final diagnoses:  COPD exacerbation [facility])   Discharge Instructions   None    ED Prescriptions    Medication Sig Dispense Auth.  Provider   doxycycline (VIBRA-TABS) 100 MG tablet Take 1 tablet (100 mg total) by mouth 2 (two) times daily. 20 tablet Payton Mccallum, MD   predniSONE (DELTASONE) 20 MG tablet Take 1 tablet (20 mg total) by mouth daily. 7 tablet Kirtis Challis, Pamala Hurry, MD   benzonatate (TESSALON) 200 MG capsule Take 1 capsule (200 mg total) by mouth 3 (three) times daily as needed. 30 capsule Payton Mccallum, MD      1. diagnosis reviewed with patient 2. rx as per orders above; reviewed possible side effects, interactions, risks and benefits  3. Recommend supportive treatment with increased fluids; continue current home medications 4. Follow-up prn if symptoms worsen or don't improve   Controlled Substance Prescriptions [place] Controlled Substance Registry consulted? Not Applicable   Payton Mccallum, MD 12/03/17 1126

## 2018-01-03 ENCOUNTER — Encounter: Payer: Self-pay | Admitting: Emergency Medicine

## 2018-01-03 ENCOUNTER — Ambulatory Visit
Admission: EM | Admit: 2018-01-03 | Discharge: 2018-01-03 | Disposition: A | Discharge status: Home / Self Care | Payer: Self-pay | Attending: Family Medicine | Admitting: Family Medicine

## 2018-01-03 ENCOUNTER — Other Ambulatory Visit: Payer: Self-pay

## 2018-01-03 DIAGNOSIS — J441 Chronic obstructive pulmonary disease with (acute) exacerbation: Secondary | ICD-10-CM

## 2018-01-03 MED ORDER — DOXYCYCLINE HYCLATE 100 MG PO CAPS: 100.0000 mg | ORAL_CAPSULE | Freq: Two times a day (BID) | ORAL | 0 refills | Status: DC

## 2018-01-03 MED ORDER — GUAIFENESIN-CODEINE 100-10 MG/5ML PO SYRP: 5.0000 mL | ORAL_SOLUTION | Freq: Three times a day (TID) | ORAL | 0 refills | Status: DC | PRN

## 2018-01-03 MED ORDER — PREDNISONE 50 MG PO TABS: 50.0000 mg | ORAL_TABLET | Freq: Every day | ORAL | 0 refills | Status: AC

## 2018-01-03 NOTE — ED Provider Notes (Signed)
[name]    CSN: [id number] Arrival date & time: 01/03/18  7829  History   Chief Complaint Chief Complaint  Patient presents with  . Cough   HPI  52 year old female presents with cough, wheezing.  2-week history of productive cough.  Green sputum.  More sputum production and normal.  Associated wheezing.  No shortness of breath at this time.  No fever.  No chills.  She is taken over-the-counter medication without improvement.  She endorses compliance with her home COPD medications.  Patient is concerned that she is having an exacerbation.  No reported sick contacts.  No other associated symptoms.  No other complaints.  PMH, Surgical Hx, Family Hx, Social History reviewed and updated as below.  Past Medical History:  Diagnosis Date  . COPD (chronic obstructive pulmonary disease) (HCC)   . Thyroid disease    Past Surgical History:  Procedure Laterality Date  . BREAST CYST ASPIRATION Left 1992  . CESAREAN SECTION    . WRIST GANGLION EXCISION     OB History    Gravida  2   Para  2   Term      Preterm      AB      Living        SAB      TAB      Ectopic      Multiple      Live Births             Home Medications    Prior to Admission medications   Medication Sig Start Date End Date Taking? Authorizing Provider  albuterol (PROVENTIL HFA;VENTOLIN HFA) 108 (90 BASE) MCG/ACT inhaler Inhale into the lungs every 6 (six) hours as needed for wheezing or shortness of breath.   Yes [provider]  Fluticasone-Salmeterol (ADVAIR) 100-50 MCG/DOSE AEPB Inhale 1 puff into the lungs 2 (two) times daily.   Yes [provider]  levothyroxine (SYNTHROID, LEVOTHROID) 50 MCG tablet Take 75 mcg by mouth daily before breakfast.    Yes [provider]  Spacer/Aero-Holding Chambers (AEROCHAMBER PLUS) inhaler Use as instructed 06/09/17  Yes Domenick Gong, MD  tiotropium (SPIRIVA) 18 MCG inhalation capsule Place 18 mcg into inhaler and  inhale daily.   Yes [provider]  doxycycline (VIBRAMYCIN) 100 MG capsule Take 1 capsule (100 mg total) by mouth 2 (two) times daily. 01/03/18   Dyann Goodspeed, Verdis Frederickson, DO  guaiFENesin-codeine (ROBITUSSIN AC) 100-10 MG/5ML syrup Take 5 mLs by mouth 3 (three) times daily as needed for cough. 01/03/18   Tommie Sams, DO  predniSONE (DELTASONE) 50 MG tablet Take 1 tablet (50 mg total) by mouth daily for 5 days. 01/03/18 01/08/18  Tommie Sams, DO    Family History Family History  Problem Relation Age of Onset  . Dementia Mother   . Cancer Father   . Liver cancer Father   . Breast cancer Neg Hx     Social History Social History   Tobacco Use  . Smoking status: Current Every Day Smoker    Packs/day: 0.25    Years: 40.00    Pack years: 10.00    Types: Cigarettes  . Smokeless tobacco: Never Used  Substance Use Topics  . Alcohol use: No  . Drug use: Never     Allergies   Percocet [oxycodone-acetaminophen]   Review of Systems Review of Systems  Constitutional: Negative for fever.  Respiratory: Positive for cough and wheezing.    Physical Exam Triage Vital Signs  ED Triage Vitals [01/03/18 0957]  Enc Vitals Group     BP 116/82     Pulse Rate 83     Resp 16     Temp 98.6 F (37 C)     Temp Source Oral     SpO2 99 %     Weight 107 lb (48.5 kg)     Height 5\' 5"  (1.651 m)     Head Circumference      Peak Flow      Pain Score 0     Pain Loc      Pain Edu?      Excl. in GC?    Updated Vital Signs BP 116/82 (BP Location: Left Arm)   Pulse 83   Temp 98.6 F (37 C) (Oral)   Resp 16   Ht 5\' 5"  (1.651 m)   Wt 48.5 kg   LMP 02/17/2014   SpO2 99%   BMI 17.81 kg/m   Visual Acuity Right Eye Distance:   Left Eye Distance:   Bilateral Distance:    Right Eye Near:   Left Eye Near:    Bilateral Near:     Physical Exam  Constitutional: She is oriented to person, place, and time. She appears well-developed. No distress.  HENT:  Head: Normocephalic and  atraumatic.  Cardiovascular: Normal rate and regular rhythm.  Pulmonary/Chest: Effort normal and breath sounds normal. She has no wheezes. She has no rales.  Neurological: She is alert and oriented to person, place, and time.  Psychiatric: She has a normal mood and affect. Her behavior is normal.  Nursing note and vitals reviewed.  UC Treatments / Results  Labs (all labs ordered are listed, but only abnormal results are displayed) Labs Reviewed - No data to display  EKG None  Radiology No results found.  Procedures Procedures (including critical care time)  Medications Ordered in UC Medications - No data to display  Initial Impression / Assessment and Plan / UC Course  I have reviewed the triage vital signs and the nursing notes.  Pertinent labs & imaging results that were available during my care of the patient were reviewed by me and considered in my medical decision making (see chart for details).    52 year old female presents with COPD exacerbation.  Treated with doxycycline, prednisone, guaifenesin with codeine.  Final Clinical Impressions(s) / UC Diagnoses   Final diagnoses:  COPD exacerbation [facility])     Discharge Instructions     Medications as prescribed.  Take care  Dr. Adriana Simas    ED Prescriptions    Medication Sig Dispense Auth. Provider   predniSONE (DELTASONE) 50 MG tablet Take 1 tablet (50 mg total) by mouth daily for 5 days. 5 tablet Fedra Lanter G, DO   doxycycline (VIBRAMYCIN) 100 MG capsule Take 1 capsule (100 mg total) by mouth 2 (two) times daily. 14 capsule Danissa Rundle G, DO   guaiFENesin-codeine (ROBITUSSIN AC) 100-10 MG/5ML syrup Take 5 mLs by mouth 3 (three) times daily as needed for cough. 120 mL Tommie Sams, DO     Controlled Substance Prescriptions  Controlled Substance Registry consulted? Not Applicable   Tommie Sams, DO 01/03/18 1104

## 2018-01-03 NOTE — ED Triage Notes (Signed)
Patient in today c/o productive cough (green) x 2 weeks. Patient has COPD. Patient denies fever. Patient has tried OTC Mucinex and Robitussin without relief. Patient takes Advair, Spiriva and Alubterol rescue inhaler.

## 2018-01-03 NOTE — Discharge Instructions (Signed)
Medications as prescribed. ° °Take care ° °Dr. Foxx Klarich  °

## 2018-02-06 ENCOUNTER — Encounter: Payer: Self-pay | Admitting: Emergency Medicine

## 2018-02-06 ENCOUNTER — Other Ambulatory Visit: Payer: Self-pay

## 2018-02-06 ENCOUNTER — Ambulatory Visit
Admission: EM | Admit: 2018-02-06 | Discharge: 2018-02-06 | Disposition: A | Discharge status: Home / Self Care | Payer: Self-pay | Attending: Family Medicine | Admitting: Family Medicine

## 2018-02-06 DIAGNOSIS — J441 Chronic obstructive pulmonary disease with (acute) exacerbation: Secondary | ICD-10-CM

## 2018-02-06 MED ORDER — DOXYCYCLINE HYCLATE 100 MG PO CAPS: 100.0000 mg | ORAL_CAPSULE | Freq: Two times a day (BID) | ORAL | 0 refills | Status: DC

## 2018-02-06 MED ORDER — PREDNISONE 50 MG PO TABS: 50.0000 mg | ORAL_TABLET | Freq: Every day | ORAL | 0 refills | Status: DC

## 2018-02-06 NOTE — ED Triage Notes (Signed)
Pt c/o cough, green sputum production, and fatigue. Started 3 days ago. She has h/o of COPD. Denies SOB or fever.

## 2018-02-06 NOTE — ED Provider Notes (Signed)
[name]    CSN: [id number] Arrival date & time: 02/06/18  1719     History   Chief Complaint Chief Complaint  Patient presents with  . Cough    HPI April Sweeney is a 52 y.o. female.   The history is provided by the patient.  Cough  Associated symptoms: wheezing   URI  Presenting symptoms: cough and fatigue   Severity:  Moderate Onset quality:  Sudden Duration:  6 days Timing:  Constant Progression:  Worsening Chronicity:  New Relieved by:  Nothing Ineffective treatments:  OTC medications Associated symptoms: wheezing   Risk factors: chronic respiratory disease (copd) and sick contacts     Past Medical History:  Diagnosis Date  . COPD (chronic obstructive pulmonary disease) (HCC)   . Thyroid disease     There are no active problems to display for this patient.   Past Surgical History:  Procedure Laterality Date  . BREAST CYST ASPIRATION Left 1992  . CESAREAN SECTION    . WRIST GANGLION EXCISION      OB History    Gravida  2   Para  2   Term      Preterm      AB      Living        SAB      TAB      Ectopic      Multiple      Live Births               Home Medications    Prior to Admission medications   Medication Sig Start Date End Date Taking? Authorizing Provider  albuterol (PROVENTIL HFA;VENTOLIN HFA) 108 (90 BASE) MCG/ACT inhaler Inhale into the lungs every 6 (six) hours as needed for wheezing or shortness of breath.   Yes [provider]  Fluticasone-Salmeterol (ADVAIR) 100-50 MCG/DOSE AEPB Inhale 1 puff into the lungs 2 (two) times daily.   Yes [provider]  levothyroxine (SYNTHROID, LEVOTHROID) 50 MCG tablet Take 75 mcg by mouth daily before breakfast.    Yes [provider]  tiotropium (SPIRIVA) 18 MCG inhalation capsule Place 18 mcg into inhaler and inhale daily.   Yes [provider]  doxycycline (VIBRAMYCIN) 100 MG capsule Take 1 capsule (100 mg total) by mouth 2  (two) times daily. 02/06/18   April Mccallumonty, April Slingerland, MD  guaiFENesin-codeine [facility](ROBITUSSIN AC) 100-10 MG/5ML syrup Take 5 mLs by mouth 3 (three) times daily as needed for cough. 01/03/18   April Sweeney, Jayce G, DO  predniSONE (DELTASONE) 50 MG tablet Take 1 tablet (50 mg total) by mouth daily. 02/06/18   April Mccallumonty, April Baugh, MD  Spacer/Aero-Holding Chambers (AEROCHAMBER PLUS) inhaler Use as instructed 06/09/17   Domenick Sweeney, [name], MD    Family History Family History  Problem Relation Age of Onset  . Dementia Mother   . Cancer Father   . Liver cancer Father   . Breast cancer Neg Hx     Social History Social History   Tobacco Use  . Smoking status: Current Every Day Smoker    Packs/day: 0.25    Years: 40.00    Pack years: 10.00    Types: Cigarettes  . Smokeless tobacco: Never Used  Substance Use Topics  . Alcohol use: No  . Drug use: Never     Allergies   Percocet [oxycodone-acetaminophen]   Review of Systems Review of Systems  Constitutional: Positive for fatigue.  Respiratory: Positive for cough and wheezing.      Physical  Exam Triage Vital Signs ED Triage Vitals  Enc Vitals Group     BP 02/06/18 1746 (!) 147/83     Pulse Rate 02/06/18 1746 75     Resp 02/06/18 1746 18     Temp 02/06/18 1746 98.4 F (36.9 C)     Temp Source 02/06/18 1746 Oral     SpO2 02/06/18 1746 99 %     Weight 02/06/18 1743 107 lb (48.5 kg)     Height 02/06/18 1743 5\' 5"  (1.651 m)     Head Circumference --      Peak Flow --      Pain Score 02/06/18 1743 6     Pain Loc --      Pain Edu? --      Excl. in GC? --    No data found.  Updated Vital Signs BP (!) 147/83 (BP Location: Left Arm)   Pulse 75   Temp 98.4 F (36.9 C) (Oral)   Resp 18   Ht 5\' 5"  (1.651 m)   Wt 48.5 kg   LMP 02/17/2014   SpO2 99%   BMI 17.81 kg/m   Visual Acuity Right Eye Distance:   Left Eye Distance:   Bilateral Distance:    Right Eye Near:   Left Eye Near:    Bilateral Near:     Physical Exam  Constitutional: She  appears well-developed and well-nourished. No distress.  HENT:  Head: Normocephalic and atraumatic.  Right Ear: Tympanic membrane, external ear and ear canal normal.  Left Ear: Tympanic membrane, external ear and ear canal normal.  Nose: No nose lacerations, sinus tenderness, nasal deformity, septal deviation or nasal septal hematoma. No epistaxis.  No foreign bodies.  Mouth/Throat: Uvula is midline, oropharynx is clear and moist and mucous membranes are normal. No oropharyngeal exudate.  Neck: Normal range of motion. Neck supple. No thyromegaly present.  Cardiovascular: Normal rate, regular rhythm and normal heart sounds.  Pulmonary/Chest: Effort normal. No respiratory distress. She has wheezes (few, diffuse and moderate diffuse bilateral rhonchi). She has no rales.  Lymphadenopathy:    She has no cervical adenopathy.  Skin: She is not diaphoretic.  Nursing note and vitals reviewed.    UC Treatments / Results  Labs (all labs ordered are listed, but only abnormal results are displayed) Labs Reviewed - No data to display  EKG None  Radiology No results found.  Procedures Procedures (including critical care time)  Medications Ordered in UC Medications - No data to display  Initial Impression / Assessment and Plan / UC Course  I have reviewed the triage vital signs and the nursing notes.  Pertinent labs & imaging results that were available during my care of the patient were reviewed by me and considered in my medical decision making (see chart for details).      Final Clinical Impressions(s) / UC Diagnoses   Final diagnoses:  COPD exacerbation [facility])   Discharge Instructions   None    ED Prescriptions    Medication Sig Dispense Auth. Provider   doxycycline (VIBRAMYCIN) 100 MG capsule Take 1 capsule (100 mg total) by mouth 2 (two) times daily. 20 capsule April Mccallum, MD   predniSONE (DELTASONE) 50 MG tablet Take 1 tablet (50 mg total) by mouth daily. 7 tablet  April Mccallum, MD     1. diagnosis reviewed with patient 2. rx as per orders above; reviewed possible side effects, interactions, risks and benefits  3. Continue home inhalers 4. Recommend smoking cessation 5. Follow-up prn  if symptoms worsen or don't improve   Controlled Substance Prescriptions [place] Controlled Substance Registry consulted? Not Applicable   April Mccallum, MD 02/06/18 [phone]

## 2018-02-19 ENCOUNTER — Encounter: Payer: Self-pay | Admitting: Family Medicine

## 2018-03-26 ENCOUNTER — Other Ambulatory Visit: Payer: Self-pay

## 2018-03-26 ENCOUNTER — Ambulatory Visit
Admission: EM | Admit: 2018-03-26 | Discharge: 2018-03-26 | Disposition: A | Discharge status: Home / Self Care | Payer: Self-pay | Attending: Family Medicine | Admitting: Family Medicine

## 2018-03-26 DIAGNOSIS — J441 Chronic obstructive pulmonary disease with (acute) exacerbation: Secondary | ICD-10-CM | POA: Insufficient documentation

## 2018-03-26 DIAGNOSIS — Z720 Tobacco use: Secondary | ICD-10-CM

## 2018-03-26 MED ORDER — PREDNISONE 20 MG PO TABS: ORAL_TABLET | ORAL | 0 refills | Status: DC

## 2018-03-26 MED ORDER — AMOXICILLIN-POT CLAVULANATE 875-125 MG PO TABS: 1.0000 | ORAL_TABLET | Freq: Two times a day (BID) | ORAL | 0 refills | Status: DC

## 2018-03-26 NOTE — ED Triage Notes (Signed)
Patient complains of cough, congestion, shortness of breath x 1 week. Reports a history of COPD.

## 2018-03-26 NOTE — ED Provider Notes (Signed)
[name]    CSN: [id number] Arrival date & time: 03/26/18  0908     History   Chief Complaint Chief Complaint  Patient presents with  . Cough    HPI April Sweeney is a 53 y.o. female.   The history is provided by the patient.  Cough  Associated symptoms: wheezing   URI  Presenting symptoms: congestion and cough   Severity:  Moderate Onset quality:  Sudden Duration:  1 week Timing:  Constant Progression:  Worsening Chronicity:  New Relieved by:  Nothing Ineffective treatments:  OTC medications Associated symptoms: sinus pain and wheezing   Risk factors: chronic respiratory disease (copd) and sick contacts     Past Medical History:  Diagnosis Date  . COPD (chronic obstructive pulmonary disease) (HCC)   . Thyroid disease     There are no active problems to display for this patient.   Past Surgical History:  Procedure Laterality Date  . BREAST CYST ASPIRATION Left 1992  . CESAREAN SECTION    . WRIST GANGLION EXCISION      OB History    Gravida  2   Para  2   Term      Preterm      AB      Living        SAB      TAB      Ectopic      Multiple      Live Births               Home Medications    Prior to Admission medications   Medication Sig Start Date End Date Taking? Authorizing Provider  albuterol (PROVENTIL HFA;VENTOLIN HFA) 108 (90 BASE) MCG/ACT inhaler Inhale into the lungs every 6 (six) hours as needed for wheezing or shortness of breath.   Yes [provider]  Fluticasone-Salmeterol (ADVAIR) 100-50 MCG/DOSE AEPB Inhale 1 puff into the lungs 2 (two) times daily.   Yes [provider]  levothyroxine (SYNTHROID, LEVOTHROID) 50 MCG tablet Take 75 mcg by mouth daily before breakfast.    Yes [provider]  Spacer/Aero-Holding Chambers (AEROCHAMBER PLUS) inhaler Use as instructed 06/09/17  Yes Domenick GongMortenson, [name], MD  tiotropium (SPIRIVA) 18 MCG inhalation capsule Place 18 mcg into inhaler and  inhale daily.   Yes [provider]  amoxicillin-clavulanate (AUGMENTIN) 875-125 MG tablet Take 1 tablet by mouth 2 (two) times daily. 03/26/18   [name], Texanna Hilburn, MD  doxycycline (VIBRAMYCIN) 100 MG capsule Take 1 capsule (100 mg total) by mouth 2 (two) times daily. 02/06/18   [name], Yameli Delamater, MD  guaiFENesin-codeine [facility](ROBITUSSIN AC) 100-10 MG/5ML syrup Take 5 mLs by mouth 3 (three) times daily as needed for cough. 01/03/18   [name], Jayce G, DO  predniSONE (DELTASONE) 20 MG tablet 3 tabs po once day 1, then 2 tabs po qd x 2 days, then 1 tab po qd x 2 days, then half a tab po qd x 2 days 03/26/18   [name], Labrina Lines, MD    Family History Family History  Problem Relation Age of Onset  . Dementia Mother   . Cancer Father   . Liver cancer Father   . Breast cancer Neg Hx     Social History Social History   Tobacco Use  . Smoking status: Current Every Day Smoker    Packs/day: 0.25    Years: 40.00    Pack years: 10.00    Types: Cigarettes  . Smokeless tobacco: Never Used  Substance Use  Topics  . Alcohol use: No  . Drug use: Never     Allergies   Percocet [oxycodone-acetaminophen]   Review of Systems Review of Systems  HENT: Positive for congestion and sinus pain.   Respiratory: Positive for cough and wheezing.      Physical Exam Triage Vital Signs ED Triage Vitals  Enc Vitals Group     BP 03/26/18 0924 125/73     Pulse Rate 03/26/18 0924 87     Resp 03/26/18 0924 18     Temp 03/26/18 0924 98.5 F (36.9 C)     Temp Source 03/26/18 0924 Oral     SpO2 03/26/18 0924 100 %     Weight 03/26/18 0922 110 lb (49.9 kg)     Height 03/26/18 0922 5\' 5"  (1.651 m)     Head Circumference --      Peak Flow --      Pain Score 03/26/18 0922 5     Pain Loc --      Pain Edu? --      Excl. in GC? --    No data found.  Updated Vital Signs BP 125/73 (BP Location: Right Arm)   Pulse 87   Temp 98.5 F (36.9 C) (Oral)   Resp 18   Ht 5\' 5"  (1.651 m)   Wt 49.9 kg   LMP 02/17/2014    SpO2 100%   BMI 18.30 kg/m   Visual Acuity Right Eye Distance:   Left Eye Distance:   Bilateral Distance:    Right Eye Near:   Left Eye Near:    Bilateral Near:     Physical Exam Vitals signs and nursing note reviewed.  Constitutional:      General: She is not in acute distress.    Appearance: She is well-developed. She is not toxic-appearing or diaphoretic.  HENT:     Head: Normocephalic and atraumatic.     Right Ear: Tympanic membrane, ear canal and external ear normal.     Left Ear: Tympanic membrane, ear canal and external ear normal.     Mouth/Throat:     Pharynx: Uvula midline. No oropharyngeal exudate.  Eyes:     General: No scleral icterus.       Right eye: No discharge.        Left eye: No discharge.  Neck:     Musculoskeletal: Normal range of motion and neck supple.     Thyroid: No thyromegaly.  Cardiovascular:     Rate and Rhythm: Normal rate and regular rhythm.     Heart sounds: Normal heart sounds.  Pulmonary:     Effort: Pulmonary effort is normal. No respiratory distress.     Breath sounds: No stridor. Rhonchi present. No wheezing or rales.  Lymphadenopathy:     Cervical: No cervical adenopathy.  Neurological:     Mental Status: She is alert.      UC Treatments / Results  Labs (all labs ordered are listed, but only abnormal results are displayed) Labs Reviewed - No data to display  EKG None  Radiology No results found.  Procedures Procedures (including critical care time)  Medications Ordered in UC Medications - No data to display  Initial Impression / Assessment and Plan / UC Course  I have reviewed the triage vital signs and the nursing notes.  Pertinent labs & imaging results that were available during my care of the patient were reviewed by me and considered in my medical decision making (see chart for details).  Final Clinical Impressions(s) / UC Diagnoses   Final diagnoses:  COPD exacerbation [facility])    ED  Prescriptions    Medication Sig Dispense Auth. Provider   amoxicillin-clavulanate (AUGMENTIN) 875-125 MG tablet Take 1 tablet by mouth 2 (two) times daily. 20 tablet Payton Mccallum, MD   predniSONE (DELTASONE) 20 MG tablet 3 tabs po once day 1, then 2 tabs po qd x 2 days, then 1 tab po qd x 2 days, then half a tab po qd x 2 days 10 tablet Payton Mccallum, MD     1. diagnosis reviewed with patient 2. rx as per orders above; reviewed possible side effects, interactions, risks and benefits  3. Recommend supportive treatment with rest, fluids; continue home inhaler  4. Follow-up prn if symptoms worsen or don't improve  Controlled Substance Prescriptions [place] Controlled Substance Registry consulted? Not Applicable   Payton Mccallum, MD 03/26/18 1136

## 2018-05-02 ENCOUNTER — Encounter: Payer: Self-pay | Admitting: Emergency Medicine

## 2018-05-02 ENCOUNTER — Other Ambulatory Visit: Payer: Self-pay

## 2018-05-02 ENCOUNTER — Ambulatory Visit
Admission: EM | Admit: 2018-05-02 | Discharge: 2018-05-02 | Disposition: A | Discharge status: Home / Self Care | Payer: Self-pay | Attending: Family Medicine | Admitting: Family Medicine

## 2018-05-02 DIAGNOSIS — F17210 Nicotine dependence, cigarettes, uncomplicated: Secondary | ICD-10-CM

## 2018-05-02 DIAGNOSIS — J441 Chronic obstructive pulmonary disease with (acute) exacerbation: Secondary | ICD-10-CM

## 2018-05-02 MED ORDER — PREDNISONE 20 MG PO TABS: ORAL_TABLET | ORAL | 0 refills | Status: DC

## 2018-05-02 MED ORDER — AMOXICILLIN-POT CLAVULANATE 875-125 MG PO TABS: 1.0000 | ORAL_TABLET | Freq: Two times a day (BID) | ORAL | 0 refills | Status: DC

## 2018-05-02 NOTE — ED Provider Notes (Signed)
[name]    CSN: [id number] Arrival date & time: 05/02/18  1008     History   Chief Complaint Chief Complaint  Patient presents with  . Cough    APPT    HPI Cason JONNAH MCLEISH is a 53 y.o. female.   HPI  -year-old female well-known to the practice, she has an extensive history of recurrent COPD. Presents with cough, wheezing and congestion.  She states  started about a week ago.  Had no fever or chills.  She has had a lot of mucus production and coughing particular at nighttime.  She was last seen here on 03/26/2018 with another episode of COPD exacerbation but she states that the and prednisone were the best that she has had for relief of her symptoms.        Past Medical History:  Diagnosis Date  . COPD (chronic obstructive pulmonary disease) (HCC)   . Thyroid disease     There are no active problems to display for this patient.   Past Surgical History:  Procedure Laterality Date  . BREAST CYST ASPIRATION Left 1992  . CESAREAN SECTION    . WRIST GANGLION EXCISION      OB History    Gravida  2   Para  2   Term      Preterm      AB      Living        SAB      TAB      Ectopic      Multiple      Live Births               Home Medications    Prior to Admission medications   Medication Sig Start Date End Date Taking? Authorizing Provider  albuterol (PROVENTIL HFA;VENTOLIN HFA) 108 (90 BASE) MCG/ACT inhaler Inhale into the lungs every 6 (six) hours as needed for wheezing or shortness of breath.   Yes [provider]  Fluticasone-Salmeterol (ADVAIR) 100-50 MCG/DOSE AEPB Inhale 1 puff into the lungs 2 (two) times daily.   Yes [provider]  levothyroxine (SYNTHROID, LEVOTHROID) 50 MCG tablet Take 75 mcg by mouth daily before breakfast.    Yes [provider]  tiotropium (SPIRIVA) 18 MCG inhalation capsule Place 18 mcg into inhaler and inhale daily.   Yes [provider]  amoxicillin-clavulanate  (AUGMENTIN) 875-125 MG tablet Take 1 tablet by mouth 2 (two) times daily. 05/02/18   Lutricia Feil, PA-C  predniSONE (DELTASONE) 20 MG tablet 3 tabs po once day 1, then 2 tabs po qd x 2 days, then 1 tab po qd x 2 days, then half a tab po qd x 2 days 05/02/18   Lutricia Feil, PA-C  Spacer/Aero-Holding Chambers (AEROCHAMBER PLUS) inhaler Use as instructed 06/09/17   Domenick Gong, MD    Family History Family History  Problem Relation Age of Onset  . Dementia Mother   . Cancer Father   . Liver cancer Father   . Breast cancer Neg Hx     Social History Social History   Tobacco Use  . Smoking status: Current Every Day Smoker    Packs/day: 0.25    Years: 40.00    Pack years: 10.00    Types: Cigarettes  . Smokeless tobacco: Never Used  Substance Use Topics  . Alcohol use: No  . Drug use: Never     Allergies   Percocet [oxycodone-acetaminophen]   Review of Systems Review of Systems  Constitutional: Positive for activity change. Negative for appetite change, chills, fatigue and fever.  Respiratory: Positive for cough, shortness of breath and wheezing.   All other systems reviewed and are negative.    Physical Exam Triage Vital Signs ED Triage Vitals  Enc Vitals Group     BP 05/02/18 1029 117/72     Pulse Rate 05/02/18 1029 82     Resp 05/02/18 1029 18     Temp 05/02/18 1029 98 F (36.7 C)     Temp Source 05/02/18 1029 Oral     SpO2 05/02/18 1029 98 %     Weight 05/02/18 1026 107 lb (48.5 kg)     Height 05/02/18 1026 5\' 5"  (1.651 m)     Head Circumference --      Peak Flow --      Pain Score 05/02/18 1100 6     Pain Loc --      Pain Edu? --      Excl. in GC? --    No data found.  Updated Vital Signs BP 117/72 (BP Location: Left Arm)   Pulse 82   Temp 98 F (36.7 C) (Oral)   Resp 18   Ht 5\' 5"  (1.651 m)   Wt 107 lb (48.5 kg)   LMP 02/17/2014   SpO2 98%   BMI 17.81 kg/m   Visual Acuity Right Eye Distance:   Left Eye Distance:   Bilateral  Distance:    Right Eye Near:   Left Eye Near:    Bilateral Near:     Physical Exam Vitals signs and nursing note reviewed.  Constitutional:      General: She is not in acute distress.    Appearance: Normal appearance. She is normal weight. She is not ill-appearing, toxic-appearing or diaphoretic.  HENT:     Head: Normocephalic and atraumatic.     Right Ear: Tympanic membrane, ear canal and external ear normal.     Left Ear: Tympanic membrane, ear canal and external ear normal.     Nose: Nose normal.     Mouth/Throat:     Mouth: Mucous membranes are moist.     Pharynx: Oropharynx is clear. No oropharyngeal exudate or posterior oropharyngeal erythema.  Eyes:     General:        Right eye: No discharge.        Left eye: No discharge.     Conjunctiva/sclera: Conjunctivae normal.  Neck:     Musculoskeletal: Normal range of motion and neck supple.  Pulmonary:     Effort: Pulmonary effort is normal.     Breath sounds: Normal breath sounds.  Musculoskeletal: Normal range of motion.  Skin:    General: Skin is warm and dry.  Neurological:     General: No focal deficit present.     Mental Status: She is alert and oriented to person, place, and time.  Psychiatric:        Mood and Affect: Mood normal.        Behavior: Behavior normal.        Thought Content: Thought content normal.        Judgment: Judgment normal.      UC Treatments / Results  Labs (all labs ordered are listed, but only abnormal results are displayed) Labs Reviewed - No data to display  EKG None  Radiology No results found.  Procedures Procedures (including critical care time)  Medications Ordered in UC Medications - No data to display  Initial Impression /  Assessment and Plan / UC Course  I have reviewed the triage vital signs and the nursing notes.  Pertinent labs & imaging results that were available during my care of the patient were reviewed by me and considered in my medical decision making  (see chart for details).   Patient had good success with the Augmentin and prednisone taper last time I will re-prescribe those.  Once again I have encouraged her to stop smoking.  He should follow-up with her primary care or her pulmonologist if she is not improving.  She should continue with the albuterol and Advair inhalers at home.   Final Clinical Impressions(s) / UC Diagnoses   Final diagnoses:  COPD exacerbation [facility])   Discharge Instructions   None    ED Prescriptions    Medication Sig Dispense Auth. Provider   amoxicillin-clavulanate (AUGMENTIN) 875-125 MG tablet Take 1 tablet by mouth 2 (two) times daily. 20 tablet Ovid Curd P, PA-C   predniSONE (DELTASONE) 20 MG tablet 3 tabs po once day 1, then 2 tabs po qd x 2 days, then 1 tab po qd x 2 days, then half a tab po qd x 2 days 10 tablet Lutricia Feil, PA-C     Controlled Substance Prescriptions [place] Controlled Substance Registry consulted? Not Applicable   Lutricia Feil, PA-C 05/02/18 1109

## 2018-05-02 NOTE — ED Triage Notes (Signed)
Pt c/o cough, wheezing, and congestion. Started about a week ago. Pt has history of COPD.

## 2018-10-14 ENCOUNTER — Other Ambulatory Visit: Payer: Self-pay

## 2018-10-14 ENCOUNTER — Encounter: Payer: Self-pay | Admitting: Emergency Medicine

## 2018-10-14 ENCOUNTER — Ambulatory Visit
Admission: EM | Admit: 2018-10-14 | Discharge: 2018-10-14 | Disposition: A | Discharge status: Home / Self Care | Payer: Self-pay | Attending: Family Medicine | Admitting: Family Medicine

## 2018-10-14 DIAGNOSIS — F17210 Nicotine dependence, cigarettes, uncomplicated: Secondary | ICD-10-CM

## 2018-10-14 DIAGNOSIS — J441 Chronic obstructive pulmonary disease with (acute) exacerbation: Secondary | ICD-10-CM

## 2018-10-14 DIAGNOSIS — Z7189 Other specified counseling: Secondary | ICD-10-CM

## 2018-10-14 MED ORDER — AMOXICILLIN-POT CLAVULANATE 875-125 MG PO TABS: 1.0000 | ORAL_TABLET | Freq: Two times a day (BID) | ORAL | 0 refills | Status: DC

## 2018-10-14 MED ORDER — PREDNISONE 10 MG PO TABS: ORAL_TABLET | ORAL | 0 refills | Status: DC

## 2018-10-14 NOTE — Discharge Instructions (Addendum)
Take medication as prescribed. Rest. Drink plenty of fluids. Use home albuterol inhaler as needed.  Refer to [facility] DHHS information and remain home until testing result received, unless seeking further care.  Follow up with your primary care physician this week as needed. Return to Urgent care for new or worsening concerns.

## 2018-10-14 NOTE — ED Triage Notes (Signed)
Patient c/o runny nose and congestion that started on Thursday.  Patient states then she developed a cough and chest congestion on Saturday.  Patient denies fevers.  Patient states she has COPD.

## 2018-10-14 NOTE — ED Provider Notes (Signed)
[name] ____________________________________________  Time seen: Approximately 8:50 AM  I have reviewed the triage vital signs and the nursing notes.   HISTORY  Chief Complaint Cough and Nasal Congestion   HPI April Sweeney is a 53 y.o. female presenting for evaluation of sickness complaints including nasal congestion, nasal drainage and cough starting on Thursday.  States Thursday was more nasal congestion and postnasal drainage that has gradually increased to coughing over the weekend.  States did hear herself wheeze this morning.  Use her Spiriva and Advair inhalers this morning but has not yet used her albuterol.  History of COPD.  No recent COPD exacerbation.  Denies known sick contacts.  No accompanying fevers, chest pain or shortness of breath.  States breathing feels consistent with previous COPD exacerbation.  Occasional scratchy throat with postnasal drainage, denies other sore throat.  No sinus pain.  Denies pain at this time.  Continues eat and drink well.  Denies other aggravating or alleviating factors.  No recent sickness.  [facility]   Past Medical History:  Diagnosis Date  . COPD (chronic obstructive pulmonary disease) (HCC)   . Thyroid disease     There are no active problems to display for this patient.   Past Surgical History:  Procedure Laterality Date  . BREAST CYST ASPIRATION Left 1992  . CESAREAN SECTION    . WRIST GANGLION EXCISION       No current facility-administered medications for this encounter.   Current Outpatient Medications:  .  albuterol (PROVENTIL HFA;VENTOLIN HFA) 108 (90 BASE) MCG/ACT inhaler, Inhale into the lungs every 6 (six) hours as needed for wheezing or shortness of breath., Disp: , Rfl:  .  Fluticasone-Salmeterol (ADVAIR) 100-50 MCG/DOSE AEPB, Inhale 1 puff into the lungs 2 (two) times daily., Disp: , Rfl:  .  levothyroxine (SYNTHROID, LEVOTHROID) 50 MCG tablet, Take 75 mcg by mouth daily  before breakfast. , Disp: , Rfl:  .  tiotropium (SPIRIVA) 18 MCG inhalation capsule, Place 18 mcg into inhaler and inhale daily., Disp: , Rfl:  .  amoxicillin-clavulanate (AUGMENTIN) 875-125 MG tablet, Take 1 tablet by mouth every 12 (twelve) hours., Disp: 20 tablet, Rfl: 0 .  predniSONE (DELTASONE) 10 MG tablet, Start 60 mg po day one, then 50 mg po day two, taper by 10 mg daily until complete., Disp: 21 tablet, Rfl: 0 .  Spacer/Aero-Holding Chambers (AEROCHAMBER PLUS) inhaler, Use as instructed, Disp: 1 each, Rfl: 2  Allergies Percocet [oxycodone-acetaminophen]  Family History  Problem Relation Age of Onset  . Dementia Mother   . Cancer Father   . Liver cancer Father   . Breast cancer Neg Hx     Social History Social History   Tobacco Use  . Smoking status: Current Every Day Smoker    Packs/day: 0.25    Years: 40.00    Pack years: 10.00    Types: Cigarettes  . Smokeless tobacco: Never Used  Substance Use Topics  . Alcohol use: No  . Drug use: Never    Review of Systems Constitutional: No fever/chills ENT as above Cardiovascular: Denies chest pain. Respiratory: Denies shortness of breath. Gastrointestinal: No abdominal pain.  Genitourinary: Negative for dysuria. Musculoskeletal: Negative for back pain. Skin: Negative for rash.   ____________________________________________   PHYSICAL EXAM:  VITAL SIGNS: ED Triage Vitals  Enc Vitals Group     BP 10/14/18 0830 133/65     Pulse Rate 10/14/18 0830 65     Resp 10/14/18 0830 16  Temp 10/14/18 0830 98 F (36.7 C)     Temp Source 10/14/18 0830 Oral     SpO2 10/14/18 0830 100 %     Weight 10/14/18 0826 105 lb (47.6 kg)     Height 10/14/18 0826 5\' 5"  (1.651 m)     Head Circumference --      Peak Flow --      Pain Score 10/14/18 0826 0     Pain Loc --      Pain Edu? --      Excl. in [place]? --     Constitutional: Alert and oriented. Well appearing and in no acute distress. Eyes: Conjunctivae are normal.   Head: Atraumatic. No sinus tenderness to palpation. No swelling. No erythema.  Ears: no erythema, normal TMs bilaterally.   Nose:Nasal congestion with clear rhinorrhea  Mouth/Throat: Mucous membranes are moist. No pharyngeal erythema. No tonsillar swelling or exudate.  Neck: No stridor.  No cervical spine tenderness to palpation. Hematological/Lymphatic/Immunilogical: No cervical lymphadenopathy. Cardiovascular: Normal rate, regular rhythm. Grossly normal heart sounds.  Good peripheral circulation. Respiratory: Normal respiratory effort.  No retractions. No wheezes, rales or rhonchi. Good air movement.  No coughing noted. Musculoskeletal: Ambulatory with steady gait.  No lower extremity edema noted. Neurologic:  Normal speech and language. No gait instability. Skin:  Skin appears warm, dry and intact. No rash noted. Psychiatric: Mood and affect are normal. Speech and behavior are normal. ___________________________________________   LABS (all labs ordered are listed, but only abnormal results are displayed)  Labs Reviewed  NOVEL CORONAVIRUS, NAA (HOSPITAL ORDER, SEND-OUT TO REF LAB)    PROCEDURES Procedures    INITIAL IMPRESSION / [place] / ED COURSE  Pertinent labs & imaging results that were available during my care of the patient were reviewed by me and considered in my medical decision making (see chart for details).  Well-appearing patient.  No acute distress.  Suspect viral illness with COPD exacerbation.  COVID-19 testing performed, advice given, and instructed to remain home until a seeking further care while awaiting test results.  [place] DHHS information given.  Will treat with prednisone and oral Augmentin.  Discussed over-the-counter Mucinex.  Continue home inhaler use as needed.Discussed indication, risks and benefits of medications with patient.  Discussed follow up with Primary care physician this week as needed. Discussed follow up and return parameters  including no resolution or any worsening concerns. Patient verbalized understanding and agreed to plan.   ____________________________________________   FINAL CLINICAL IMPRESSION(S) / ED DIAGNOSES  Final diagnoses:  COPD exacerbation ([place])  Advice Given About Covid-19 Virus Infection     ED Discharge Orders         Ordered    amoxicillin-clavulanate (AUGMENTIN) 875-125 MG tablet  Every 12 hours     10/14/18 0849    predniSONE (DELTASONE) 10 MG tablet     10/14/18 0849           Note: This dictation was prepared with Dragon dictation along with smaller phrase technology. Any transcriptional errors that result from this process are unintentional.         Marylene Land, NP 10/14/18 [phone]

## 2018-10-15 LAB — NOVEL CORONAVIRUS, NAA (HOSP ORDER, SEND-OUT TO REF LAB; TAT 18-24 HRS): SARS-CoV-2, NAA: NOT DETECTED

## 2018-10-16 ENCOUNTER — Encounter (HOSPITAL_COMMUNITY): Payer: Self-pay

## 2018-12-22 ENCOUNTER — Other Ambulatory Visit: Payer: Self-pay

## 2018-12-22 ENCOUNTER — Encounter: Payer: Self-pay | Admitting: Emergency Medicine

## 2018-12-22 ENCOUNTER — Ambulatory Visit (INDEPENDENT_AMBULATORY_CARE_PROVIDER_SITE_OTHER): Payer: Self-pay

## 2018-12-22 ENCOUNTER — Ambulatory Visit
Admission: EM | Admit: 2018-12-22 | Discharge: 2018-12-22 | Disposition: A | Discharge status: Home / Self Care | Payer: Self-pay | Attending: Family Medicine | Admitting: Family Medicine

## 2018-12-22 DIAGNOSIS — R05 Cough: Secondary | ICD-10-CM

## 2018-12-22 DIAGNOSIS — J441 Chronic obstructive pulmonary disease with (acute) exacerbation: Secondary | ICD-10-CM

## 2018-12-22 MED ORDER — PREDNISONE 10 MG PO TABS: ORAL_TABLET | ORAL | 0 refills | Status: DC

## 2018-12-22 MED ORDER — AMOXICILLIN-POT CLAVULANATE 875-125 MG PO TABS: 1.0000 | ORAL_TABLET | Freq: Two times a day (BID) | ORAL | 0 refills | Status: DC

## 2018-12-22 NOTE — ED Provider Notes (Signed)
[name]    CSN: [id number] Arrival date & time: 12/22/18  0840      History   Chief Complaint Chief Complaint  Patient presents with  . Cough    HPI Tamrah GHALIA REICKS is a 53 y.o. female.   53 yo female with a h/o COPD presents with a c/o productive cough, wheezing and congestion for the past 4 days. Denies any fevers, chills, chest pains. Has been using her inhaler.    Cough   Past Medical History:  Diagnosis Date  . COPD (chronic obstructive pulmonary disease) ([place])   . Thyroid disease     There are no active problems to display for this patient.   Past Surgical History:  Procedure Laterality Date  . BREAST CYST ASPIRATION Left 1992  . CESAREAN SECTION    . WRIST GANGLION EXCISION      OB History    Gravida  2   Para  2   Term      Preterm      AB      Living        SAB      TAB      Ectopic      Multiple      Live Births               Home Medications    Prior to Admission medications   Medication Sig Start Date End Date Taking? Authorizing Provider  albuterol (PROVENTIL HFA;VENTOLIN HFA) 108 (90 BASE) MCG/ACT inhaler Inhale into the lungs every 6 (six) hours as needed for wheezing or shortness of breath.   Yes [provider]  levothyroxine (SYNTHROID, LEVOTHROID) 50 MCG tablet Take 75 mcg by mouth daily before breakfast.    Yes [provider]  tiotropium (SPIRIVA) 18 MCG inhalation capsule Place 18 mcg into inhaler and inhale daily.   Yes [provider]  amoxicillin-clavulanate (AUGMENTIN) 875-125 MG tablet Take 1 tablet by mouth every 12 (twelve) hours. 12/22/18   Norval Gable, MD  Fluticasone-Salmeterol (ADVAIR) 100-50 MCG/DOSE AEPB Inhale 1 puff into the lungs 2 (two) times daily.    [provider]  predniSONE (DELTASONE) 10 MG tablet Start 60 mg po day one, then 50 mg po day two, taper by 10 mg daily until complete. 12/22/18   Norval Gable, MD  Spacer/Aero-Holding Chambers  (AEROCHAMBER PLUS) inhaler Use as instructed 06/09/17   Melynda Ripple, MD    Family History Family History  Problem Relation Age of Onset  . Dementia Mother   . Cancer Father   . Liver cancer Father   . Breast cancer Neg Hx     Social History Social History   Tobacco Use  . Smoking status: Current Every Day Smoker    Packs/day: 0.25    Years: 40.00    Pack years: 10.00    Types: Cigarettes  . Smokeless tobacco: Never Used  Substance Use Topics  . Alcohol use: No  . Drug use: Never     Allergies   Percocet [oxycodone-acetaminophen]   Review of Systems Review of Systems  Respiratory: Positive for cough.      Physical Exam Triage Vital Signs ED Triage Vitals  Enc Vitals Group     BP 12/22/18 0850 140/77     Pulse Rate 12/22/18 0850 71     Resp 12/22/18 0850 15     Temp 12/22/18 0850 98.2 F (36.8 C)     Temp Source 12/22/18 0850 Oral  SpO2 12/22/18 0850 98 %     Weight 12/22/18 0846 107 lb (48.5 kg)     Height 12/22/18 0846 5\' 5"  (1.651 m)     Head Circumference --      Peak Flow --      Pain Score 12/22/18 0846 0     Pain Loc --      Pain Edu? --      Excl. in [place]? --    No data found.  Updated Vital Signs BP 140/77 (BP Location: Right Arm)   Pulse 71   Temp 98.2 F (36.8 C) (Oral)   Resp 15   Ht 5\' 5"  (1.651 m)   Wt 48.5 kg   LMP 02/17/2014   SpO2 98%   BMI 17.81 kg/m   Visual Acuity Right Eye Distance:   Left Eye Distance:   Bilateral Distance:    Right Eye Near:   Left Eye Near:    Bilateral Near:     Physical Exam Vitals signs and nursing note reviewed.  Constitutional:      General: She is not in acute distress.    Appearance: She is not toxic-appearing.  Cardiovascular:     Rate and Rhythm: Normal rate and regular rhythm.     Heart sounds: Normal heart sounds.  Pulmonary:     Effort: Pulmonary effort is normal. No respiratory distress.     Breath sounds: No stridor. No wheezing, rhonchi or rales.     Comments:  Decreased breath sounds throughout Neurological:     Mental Status: She is alert.      UC Treatments / Results  Labs (all labs ordered are listed, but only abnormal results are displayed) Labs Reviewed  NOVEL CORONAVIRUS, NAA (HOSP ORDER, SEND-OUT TO REF LAB; TAT 18-24 HRS)    EKG   Radiology Dg Chest 2 View  Result Date: 12/22/2018 CLINICAL DATA:  53 year old with cough.  Shortness of breath.  COPD. EXAM: CHEST - 2 VIEW COMPARISON:  05/26/2017.  Chest CT 02/19/2012 FINDINGS: Again noted is hyperinflation and changes compatible with emphysema. Nodular structures on both sides of the chest are most compatible with nipple shadows and similar to the previous examination. Scarring at the apices, right side greater than left. No focal airspace disease or pulmonary edema. Stable calcifications in the right hilum. Heart and mediastinum are within normal limits. No large pleural effusions. IMPRESSION: No active cardiopulmonary disease. Hyperinflation and emphysema. Scarring at the lung apices. Electronically Signed   By: 05/28/2017 M.D.   On: 12/22/2018 09:30    Procedures Procedures (including critical care time)  Medications Ordered in UC Medications - No data to display  Initial Impression / Assessment and Plan / UC Course  I have reviewed the triage vital signs and the nursing notes.  Pertinent labs & imaging results that were available during my care of the patient were reviewed by me and considered in my medical decision making (see chart for details).      Final Clinical Impressions(s) / UC Diagnoses   Final diagnoses:  COPD exacerbation [facility])    ED Prescriptions    Medication Sig Dispense Auth. Provider   amoxicillin-clavulanate (AUGMENTIN) 875-125 MG tablet Take 1 tablet by mouth every 12 (twelve) hours. 20 tablet 12/24/2018, MD   predniSONE (DELTASONE) 10 MG tablet Start 60 mg po day one, then 50 mg po day two, taper by 10 mg daily until complete. 21 tablet [facility], MD      1. Chest x-ray  results and diagnosis reviewed with patient 2. rx as per orders above; reviewed possible side effects, interactions, risks and benefits  3. Recommend continue current inhalers 4. Follow-up prn if symptoms worsen or don't improve   PDMP not reviewed this encounter.   [name], Serrita Lueth, MD 12/22/18 [phone]

## 2018-12-22 NOTE — ED Triage Notes (Signed)
Patient c/o cough and chest congestion and runny nose that started on Thursday.  Patient denies fevers.  Patient reports that she has COPD.  Patient states that her boss tested positive for COVID on Thursday.  Patient states that she was not directly around him.

## 2018-12-23 LAB — NOVEL CORONAVIRUS, NAA (HOSP ORDER, SEND-OUT TO REF LAB; TAT 18-24 HRS): SARS-CoV-2, NAA: NOT DETECTED

## 2019-08-22 IMAGING — MG MM DIGITAL SCREENING BILAT W/ TOMO W/ CAD
9 of 12 series · 9 of 28 positions shown · non-contrast
Comparison: None.

CLINICAL DATA: Screening.

EXAM:
DIGITAL SCREENING BILATERAL MAMMOGRAM WITH TOMO AND CAD

[L MLO synth-2D]
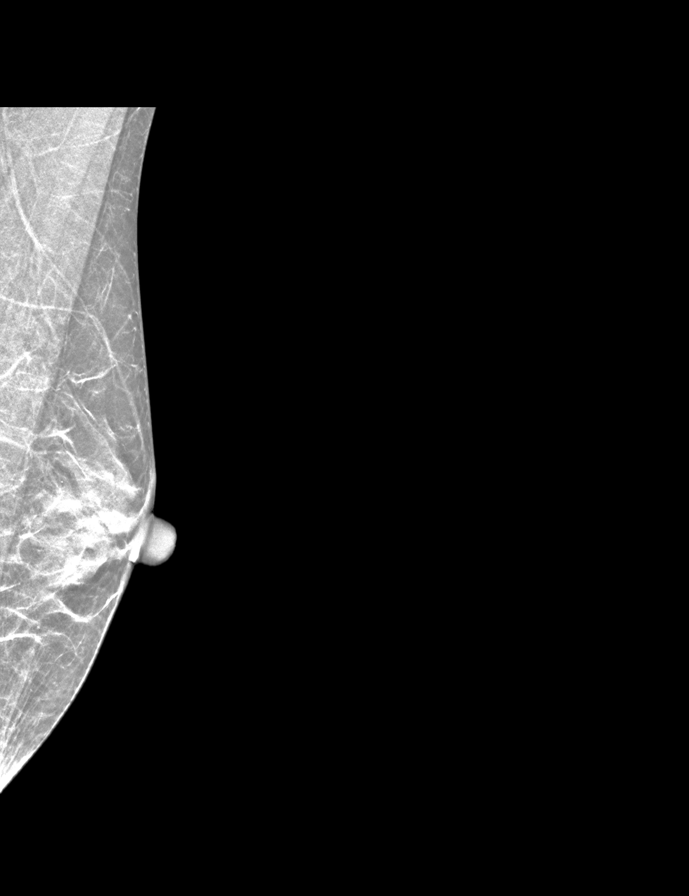

[R CC synth-2D]
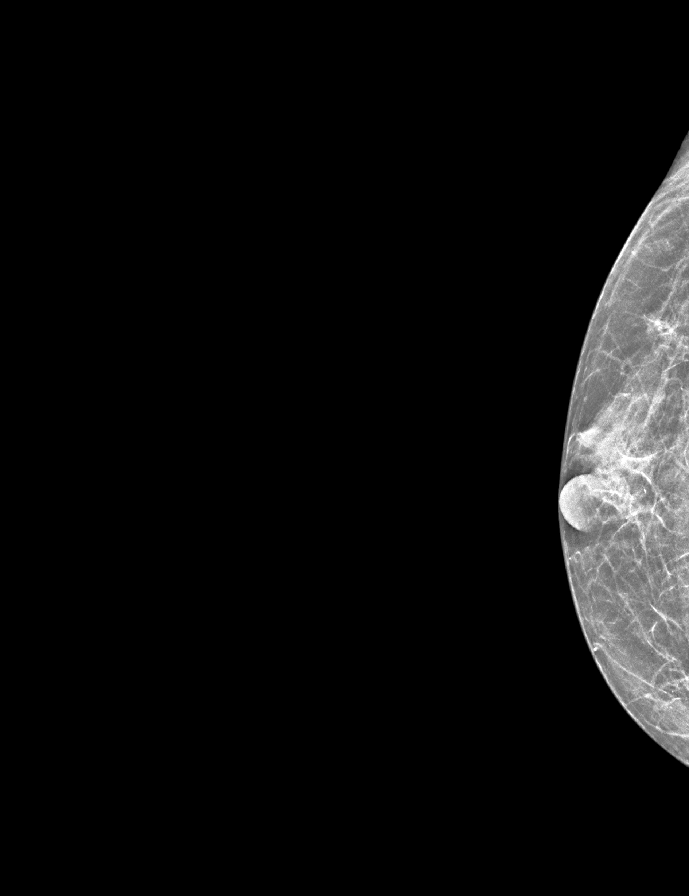

[R MLO synth-2D]
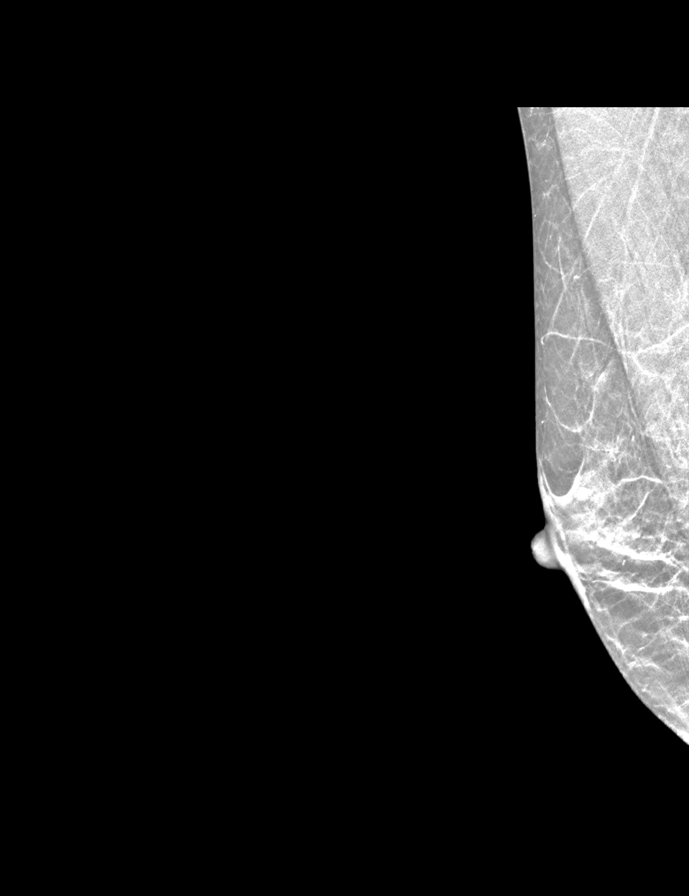

[L CC synth-2D]
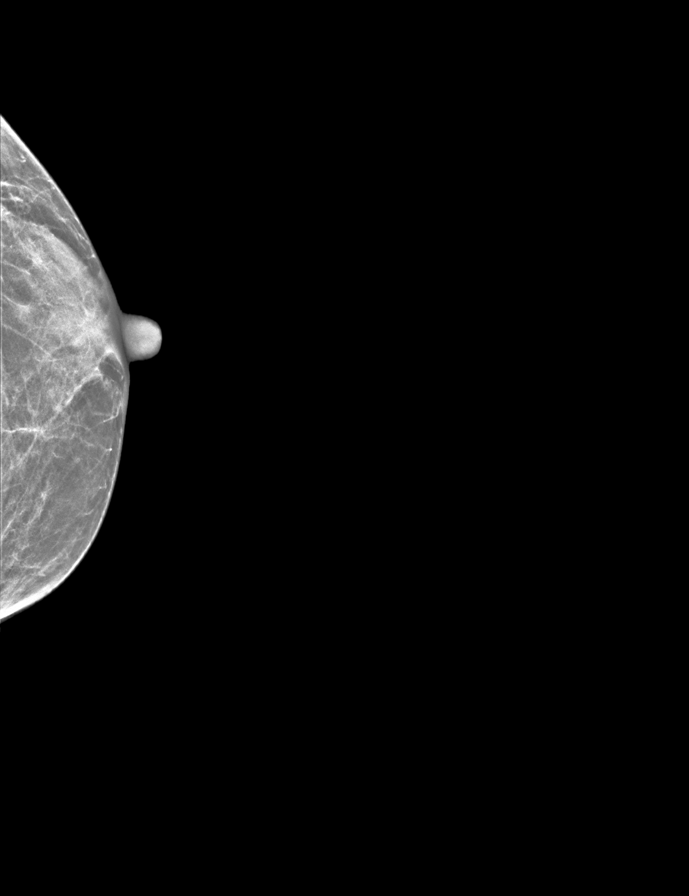

[L MLO tomo · tomo slice 17/33.0]
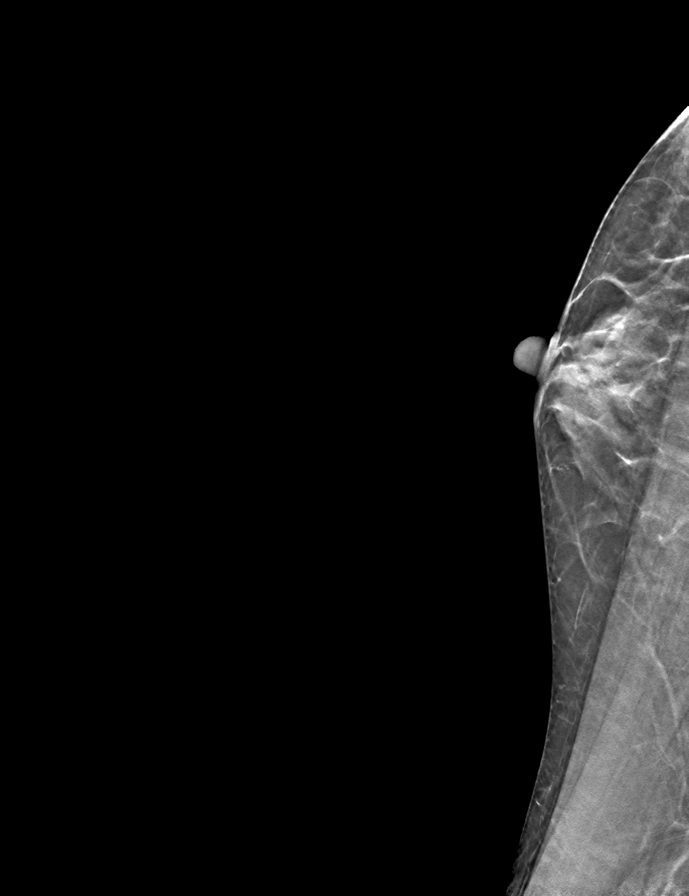

[R MLO (1 of 2)]
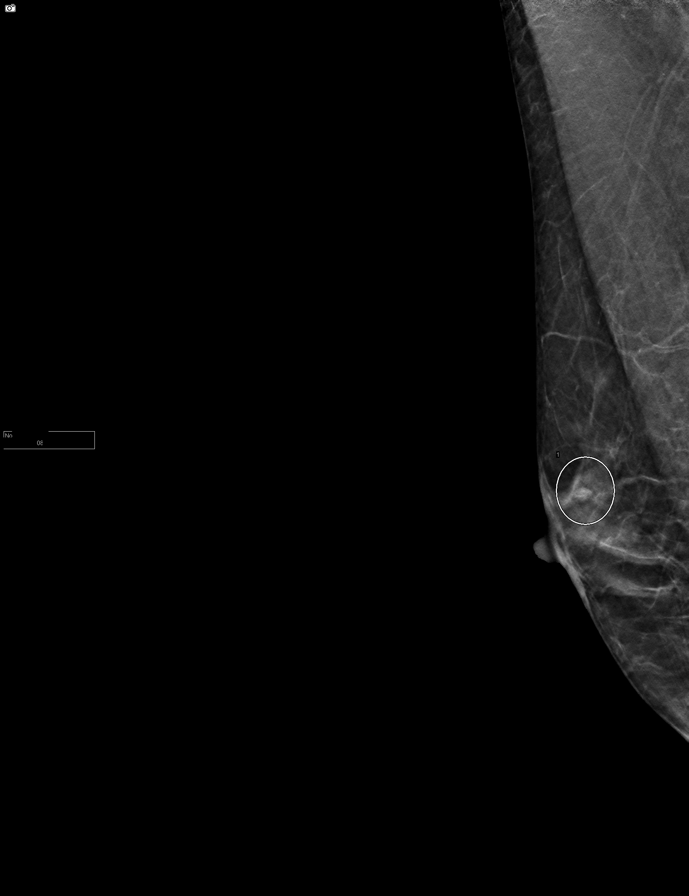

[R CC (1 of 2)]
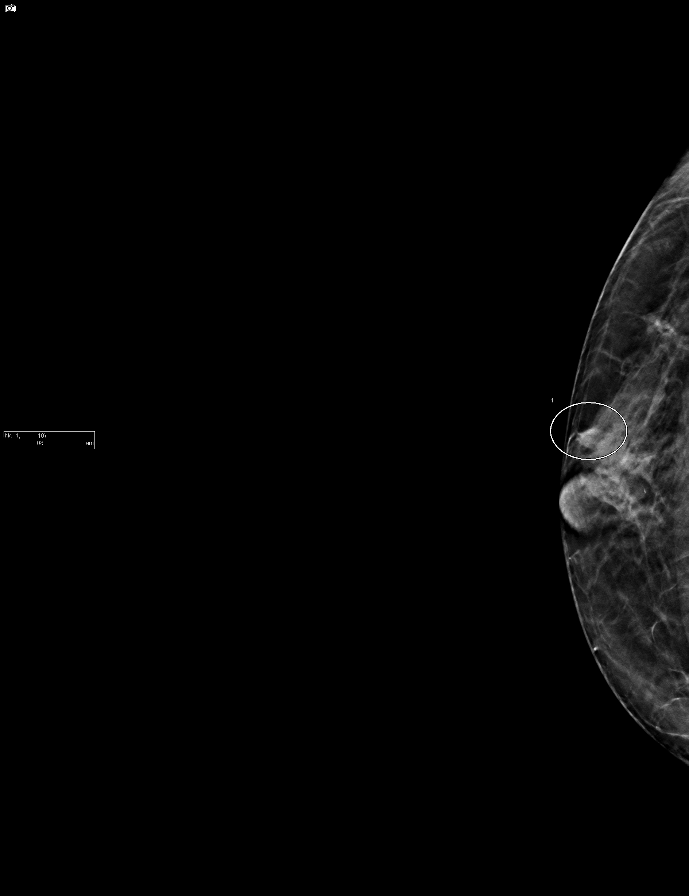

[R CC (2 of 2)]
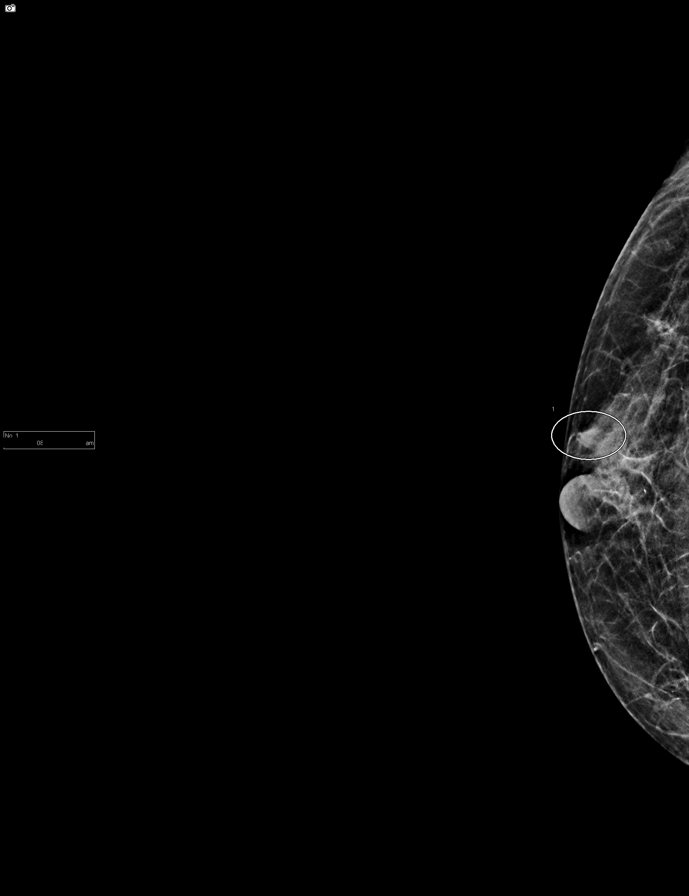

[R MLO (2 of 2)]
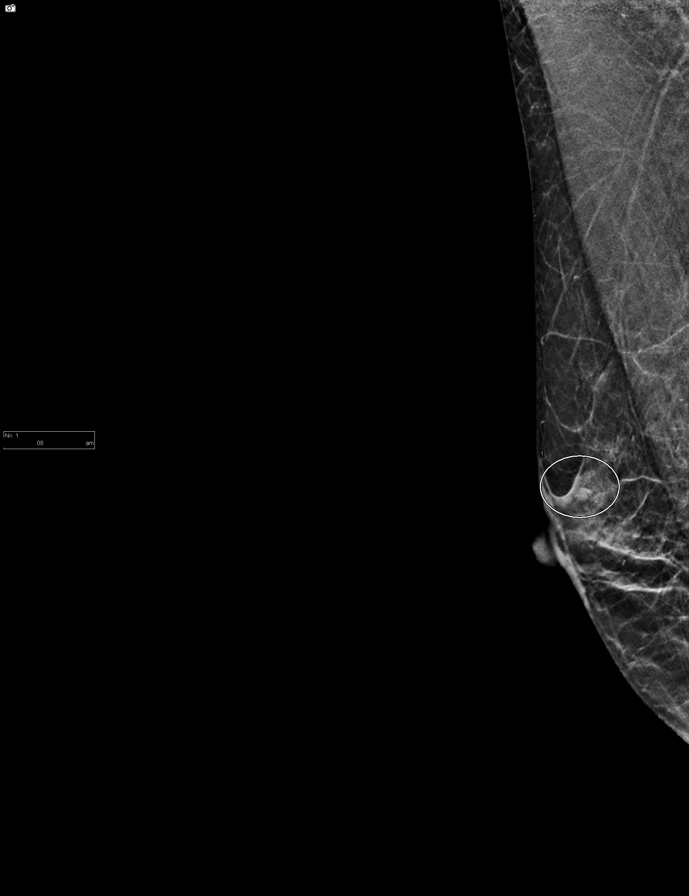

[9 of 28 positions shown; findings below may reference images not displayed]

ACR Breast Density Category c: The breast tissue is heterogeneously
dense, which may obscure small masses.
FINDINGS: In the right breast, a possible mass warrants further evaluation. In
the left breast, no findings suspicious for malignancy. Images were
processed with CAD.
IMPRESSION: Further evaluation is suggested for possible mass in the right
breast.

RECOMMENDATION:
Diagnostic mammogram and possibly ultrasound of the right breast.
(Code:[id number]-TTU)

The patient will be contacted regarding the findings, and additional
imaging will be scheduled.

BI-RADS CATEGORY  0: Incomplete. Need additional imaging evaluation
and/or prior mammograms for comparison.

## 2019-12-25 ENCOUNTER — Ambulatory Visit (INDEPENDENT_AMBULATORY_CARE_PROVIDER_SITE_OTHER): Payer: Self-pay

## 2019-12-25 ENCOUNTER — Ambulatory Visit
Admission: RE | Admit: 2019-12-25 | Discharge: 2019-12-25 | Disposition: A | Discharge status: Home / Self Care | Payer: Self-pay | Source: Ambulatory Visit | Attending: Internal Medicine | Admitting: Internal Medicine

## 2019-12-25 ENCOUNTER — Other Ambulatory Visit: Payer: Self-pay

## 2019-12-25 VITALS — BP 129/75 | HR 74 | Temp 98.0°F | Resp 18 | Ht 65.0 in | Wt 94.0 lb

## 2019-12-25 DIAGNOSIS — J441 Chronic obstructive pulmonary disease with (acute) exacerbation: Secondary | ICD-10-CM

## 2019-12-25 DIAGNOSIS — R051 Acute cough: Secondary | ICD-10-CM

## 2019-12-25 DIAGNOSIS — R093 Abnormal sputum: Secondary | ICD-10-CM

## 2019-12-25 MED ORDER — DOXYCYCLINE HYCLATE 100 MG PO CAPS: 100.0000 mg | ORAL_CAPSULE | Freq: Two times a day (BID) | ORAL | 0 refills | Status: DC

## 2019-12-25 MED ORDER — AMOXICILLIN-POT CLAVULANATE 875-125 MG PO TABS: 1.0000 | ORAL_TABLET | Freq: Two times a day (BID) | ORAL | 0 refills | Status: AC

## 2019-12-25 MED ORDER — PREDNISONE 20 MG PO TABS: 40.0000 mg | ORAL_TABLET | Freq: Every day | ORAL | 0 refills | Status: AC

## 2019-12-25 NOTE — ED Triage Notes (Signed)
Patient in today c/o productive cough (green) x 2 days. Patient states she has COPD. She took an at home covid test today and it was negative. Patient has not had the covid vaccines.

## 2019-12-25 NOTE — ED Provider Notes (Signed)
[name]    CSN: [id number] Arrival date & time: 12/25/19  1446      History   Chief Complaint Chief Complaint  Patient presents with  . Appointment  . Cough    HPI April Sweeney is a 54 y.o. female with a history of COPD comes to urgent care with complaints of 2-day history of productive cough, wheezing without significant shortness of breath.  Patient denies any fever, chills, body aches, headaches, nausea or vomiting.  She took an in-home Covid test and it was negative.  Patient is not vaccinated against COVID-19 virus.  No sick contacts.  Patient continues to smoke about half a pack of cigarettes a day.  No weight loss.  HPI  Past Medical History:  Diagnosis Date  . COPD (chronic obstructive pulmonary disease) (HCC)   . Thyroid disease     There are no problems to display for this patient.   Past Surgical History:  Procedure Laterality Date  . BREAST CYST ASPIRATION Left 1992  . CESAREAN SECTION    . WRIST GANGLION EXCISION      OB History    Gravida  2   Para  2   Term      Preterm      AB      Living        SAB      TAB      Ectopic      Multiple      Live Births               Home Medications    Prior to Admission medications   Medication Sig Start Date End Date Taking? Authorizing Provider  albuterol (PROVENTIL HFA;VENTOLIN HFA) 108 (90 BASE) MCG/ACT inhaler Inhale into the lungs every 6 (six) hours as needed for wheezing or shortness of breath.   Yes [provider]  Fluticasone-Salmeterol (ADVAIR) 100-50 MCG/DOSE AEPB Inhale 1 puff into the lungs 2 (two) times daily.   Yes [provider]  levothyroxine (SYNTHROID, LEVOTHROID) 50 MCG tablet Take 75 mcg by mouth daily before breakfast.    Yes [provider]  Spacer/Aero-Holding Chambers (AEROCHAMBER PLUS) inhaler Use as instructed 06/09/17  Yes Domenick Gong, MD  tiotropium (SPIRIVA) 18 MCG inhalation capsule Place 18 mcg into inhaler  and inhale daily.   Yes [provider]  doxycycline (VIBRAMYCIN) 100 MG capsule Take 1 capsule (100 mg total) by mouth 2 (two) times daily for 5 days. 12/25/19 12/30/19  Merrilee Jansky, MD  predniSONE (DELTASONE) 20 MG tablet Take 2 tablets (40 mg total) by mouth daily for 5 days. 12/25/19 12/30/19  Merrilee Jansky, MD    Family History Family History  Problem Relation Age of Onset  . Dementia Mother   . Cancer Father   . Liver cancer Father   . Breast cancer Neg Hx     Social History Social History   Tobacco Use  . Smoking status: Current Every Day Smoker    Packs/day: 0.25    Years: 40.00    Pack years: 10.00    Types: Cigarettes  . Smokeless tobacco: Never Used  Vaping Use  . Vaping Use: Never used  Substance Use Topics  . Alcohol use: No  . Drug use: Never     Allergies   Percocet [oxycodone-acetaminophen]   Review of Systems Review of Systems  Constitutional: Negative.   Respiratory: Positive for cough, shortness of breath and wheezing.   Cardiovascular: Negative.  Gastrointestinal: Negative.   Genitourinary: Negative.   Neurological: Negative.      Physical Exam Triage Vital Signs ED Triage Vitals [12/25/19 1505]  Enc Vitals Group     BP 129/75     Pulse Rate 74     Resp 18     Temp 98 F (36.7 C)     Temp Source Oral     SpO2 100 %     Weight 94 lb (42.6 kg)     Height 5\' 5"  (1.651 m)     Head Circumference      Peak Flow      Pain Score 0     Pain Loc      Pain Edu?      Excl. in GC?    No data found.  Updated Vital Signs BP 129/75 (BP Location: Left Arm)   Pulse 74   Temp 98 F (36.7 C) (Oral)   Resp 18   Ht 5\' 5"  (1.651 m)   Wt 42.6 kg   LMP 02/17/2014   SpO2 100%   BMI 15.64 kg/m   Visual Acuity Right Eye Distance:   Left Eye Distance:   Bilateral Distance:    Right Eye Near:   Left Eye Near:    Bilateral Near:     Physical Exam Vitals and nursing note reviewed.  Constitutional:      General: She  is not in acute distress.    Appearance: She is not ill-appearing.  Cardiovascular:     Rate and Rhythm: Normal rate and regular rhythm.  Pulmonary:     Effort: Pulmonary effort is normal. No respiratory distress.     Breath sounds: Rhonchi present. No wheezing or rales.  Abdominal:     General: Bowel sounds are normal.     Palpations: Abdomen is soft.  Musculoskeletal:        General: Normal range of motion.  Neurological:     Mental Status: She is alert.      UC Treatments / Results  Labs (all labs ordered are listed, but only abnormal results are displayed) Labs Reviewed - No data to display  EKG   Radiology No results found.  Procedures Procedures (including critical care time)  Medications Ordered in UC Medications - No data to display  Initial Impression / Assessment and Plan / UC Course  I have reviewed the triage vital signs and the nursing notes.  Pertinent labs & imaging results that were available during my care of the patient were reviewed by me and considered in my medical decision making (see chart for details).     One.  COPD with acute exacerbation: X-ray of the chest is negative for acute lung infiltrate Prednisone 40 mg orally daily for 5 days Doxycycline 100 mg twice daily for 5 days Continue bronchodilator use Return to urgent care if you have any worsening symptoms Smoke cessation counseling was given.  Patient is in the precontemplative state.  Counseling time was less than 10 minutes. Final Clinical Impressions(s) / UC Diagnoses   Final diagnoses:  COPD exacerbation [facility])     Discharge Instructions     Please take medications as prescribed Continue bronchodilator use Return to urgent care if symptoms worsen.   ED Prescriptions    Medication Sig Dispense Auth. Provider   predniSONE (DELTASONE) 20 MG tablet Take 2 tablets (40 mg total) by mouth daily for 5 days. 10 tablet Dalicia Kisner, 02/19/2014, MD   doxycycline (VIBRAMYCIN) 100 MG  capsule Take 1  capsule (100 mg total) by mouth 2 (two) times daily for 5 days. 10 capsule Prima Rayner, Britta Mccreedy, MD     PDMP not reviewed this encounter.   Merrilee Jansky, MD 12/25/19 [phone]

## 2019-12-25 NOTE — Discharge Instructions (Signed)
Please take medications as prescribed Continue bronchodilator use Return to urgent care if symptoms worsen.

## 2020-02-22 ENCOUNTER — Ambulatory Visit
Admission: RE | Admit: 2020-02-22 | Discharge: 2020-02-22 | Disposition: A | Discharge status: Home / Self Care | Payer: Self-pay | Source: Ambulatory Visit | Attending: Family Medicine | Admitting: Family Medicine

## 2020-02-22 ENCOUNTER — Other Ambulatory Visit: Payer: Self-pay

## 2020-02-22 VITALS — BP 116/80 | HR 85 | Temp 98.2°F | Resp 19 | Ht 65.0 in | Wt 94.0 lb

## 2020-02-22 DIAGNOSIS — J441 Chronic obstructive pulmonary disease with (acute) exacerbation: Secondary | ICD-10-CM

## 2020-02-22 MED ORDER — PREDNISONE 50 MG PO TABS: ORAL_TABLET | ORAL | 0 refills | Status: DC

## 2020-02-22 MED ORDER — AMOXICILLIN-POT CLAVULANATE 875-125 MG PO TABS: 1.0000 | ORAL_TABLET | Freq: Two times a day (BID) | ORAL | 0 refills | Status: DC

## 2020-02-22 NOTE — ED Triage Notes (Signed)
Pt with cough that is consistent with her COPD flare-ups. Coughing up mucous. Took at home COVID test this a.m. which was negative. Declines additional COVID test here

## 2020-02-22 NOTE — ED Provider Notes (Signed)
[name]    CSN: [id number] Arrival date & time: 02/22/20  1144  History   Chief Complaint Chief Complaint  Patient presents with  . Cough   HPI   54 year old female presents with cough and wheezing.  Patient has COPD.  She states that she feels like she is experiencing a flare.  She reports cough which is productive of sputum.  She states that it is discolored and is increased from her baseline.  She reports associated wheezing.  Mild shortness of breath.  No reported sick contacts.  She has taken her home inhalers without resolution.  No fever.  No other reported symptoms.  Declines Covid testing.  Past Medical History:  Diagnosis Date  . COPD (chronic obstructive pulmonary disease) (HCC)   . Thyroid disease    Past Surgical History:  Procedure Laterality Date  . BREAST CYST ASPIRATION Left 1992  . CESAREAN SECTION    . WRIST GANGLION EXCISION      OB History    Gravida  2   Para  2   Term      Preterm      AB      Living        SAB      IAB      Ectopic      Multiple      Live Births               Home Medications    Prior to Admission medications   Medication Sig Start Date End Date Taking? Authorizing Provider  albuterol (PROVENTIL HFA;VENTOLIN HFA) 108 (90 BASE) MCG/ACT inhaler Inhale into the lungs every 6 (six) hours as needed for wheezing or shortness of breath.    [provider]  amoxicillin-clavulanate (AUGMENTIN) 875-125 MG tablet Take 1 tablet by mouth every 12 (twelve) hours. 02/22/20   April Sams, DO  Fluticasone-Salmeterol (ADVAIR) 100-50 MCG/DOSE AEPB Inhale 1 puff into the lungs 2 (two) times daily.    [provider]  levothyroxine (SYNTHROID, LEVOTHROID) 50 MCG tablet Take 75 mcg by mouth daily before breakfast.     [provider]  predniSONE (DELTASONE) 50 MG tablet 1 tablet daily x 5 days 02/22/20   April Sams, DO  Spacer/Aero-Holding Chambers (AEROCHAMBER PLUS) inhaler Use as  instructed 06/09/17   Domenick Gong, MD  tiotropium (SPIRIVA) 18 MCG inhalation capsule Place 18 mcg into inhaler and inhale daily.    [provider]    Family History Family History  Problem Relation Age of Onset  . Dementia Mother   . Cancer Father   . Liver cancer Father   . Breast cancer Neg Hx     Social History Social History   Tobacco Use  . Smoking status: Current Every Day Smoker    Packs/day: 0.25    Years: 40.00    Pack years: 10.00    Types: Cigarettes  . Smokeless tobacco: Never Used  Vaping Use  . Vaping Use: Never used  Substance Use Topics  . Alcohol use: No  . Drug use: Never     Allergies   Percocet [oxycodone-acetaminophen]   Review of Systems Review of Systems  Constitutional: Negative for fever.  Respiratory: Positive for cough and wheezing.    Physical Exam Triage Vital Signs ED Triage Vitals  Enc Vitals Group     BP 02/22/20 1211 116/80     Pulse Rate 02/22/20 1211 85     Resp 02/22/20 1211 19  Temp 02/22/20 1211 98.2 F (36.8 C)     Temp Source 02/22/20 1211 Oral     SpO2 02/22/20 1211 98 %     Weight 02/22/20 1208 94 lb (42.6 kg)     Height 02/22/20 1208 5\' 5"  (1.651 m)     Head Circumference --      Peak Flow --      Pain Score 02/22/20 1208 0     Pain Loc --      Pain Edu? --      Excl. in GC? --     Updated Vital Signs BP 116/80 (BP Location: Left Arm)   Pulse 85   Temp 98.2 F (36.8 C) (Oral)   Resp 19   Ht 5\' 5"  (1.651 m)   Wt 42.6 kg   LMP 02/17/2014   SpO2 98%   BMI 15.64 kg/m   Visual Acuity Right Eye Distance:   Left Eye Distance:   Bilateral Distance:    Right Eye Near:   Left Eye Near:    Bilateral Near:     Physical Exam Vitals and nursing note reviewed.  Constitutional:      General: She is not in acute distress.    Appearance: Normal appearance. She is not ill-appearing.  Eyes:     General:        Right eye: No discharge.        Left eye: No discharge.      Conjunctiva/sclera: Conjunctivae normal.  Cardiovascular:     Rate and Rhythm: Normal rate and regular rhythm.  Pulmonary:     Effort: Pulmonary effort is normal.     Breath sounds: Normal breath sounds. No wheezing or rales.  Neurological:     Mental Status: She is alert.  Psychiatric:        Mood and Affect: Mood normal.        Behavior: Behavior normal.    UC Treatments / Results  Labs (all labs ordered are listed, but only abnormal results are displayed) Labs Reviewed - No data to display  EKG   Radiology No results found.  Procedures Procedures (including critical care time)  Medications Ordered in UC Medications - No data to display  Initial Impression / Assessment and Plan / UC Course  I have reviewed the triage vital signs and the nursing notes.  Pertinent labs & imaging results that were available during my care of the patient were reviewed by me and considered in my medical decision making (see chart for details).    54 year old female presents with COPD exacerbation.  Treating with Augmentin and prednisone  Final Clinical Impressions(s) / UC Diagnoses   Final diagnoses:  COPD exacerbation [facility])   Discharge Instructions   None    ED Prescriptions    Medication Sig Dispense Auth. Provider   amoxicillin-clavulanate (AUGMENTIN) 875-125 MG tablet Take 1 tablet by mouth every 12 (twelve) hours. 14 tablet Pearle Wandler G, DO   predniSONE (DELTASONE) 50 MG tablet 1 tablet daily x 5 days 5 tablet [facility] G, DO     PDMP not reviewed this encounter.   04-10-2003, Everlene Other 02/22/20 1244

## 2020-05-19 ENCOUNTER — Other Ambulatory Visit: Payer: Self-pay

## 2020-05-19 ENCOUNTER — Ambulatory Visit
Admission: EM | Admit: 2020-05-19 | Discharge: 2020-05-19 | Disposition: A | Discharge status: Home / Self Care | Payer: Self-pay | Attending: Emergency Medicine | Admitting: Emergency Medicine

## 2020-05-19 DIAGNOSIS — J441 Chronic obstructive pulmonary disease with (acute) exacerbation: Secondary | ICD-10-CM

## 2020-05-19 MED ORDER — AEROCHAMBER PLUS MISC: 2 refills | Status: AC

## 2020-05-19 MED ORDER — PREDNISONE 20 MG PO TABS: 40.0000 mg | ORAL_TABLET | Freq: Every day | ORAL | 0 refills | Status: AC

## 2020-05-19 MED ORDER — AMOXICILLIN-POT CLAVULANATE 875-125 MG PO TABS: 1.0000 | ORAL_TABLET | Freq: Two times a day (BID) | ORAL | 0 refills | Status: AC

## 2020-05-19 NOTE — ED Triage Notes (Signed)
Patient states that she has COPD and has been having cough and chest congestion x 1 week. States that her back is hurting and she is concerned she has an infection.

## 2020-05-19 NOTE — ED Provider Notes (Signed)
HPI  SUBJECTIVE:  April Sweeney is a 55 y.o. female who presents with a COPD exacerbation for the past week.  She reports a "tickle in the back of my throat" accompanied by cough productive of light green phlegm.  She is coughing up more phlegm than usual and states that her baseline phlegm is white.  She reports chest congestion, wheezing, posterior right sided chest pain present only with inspiration.  States that this pain happens when she gets "infections", and that her symptoms are identical to previous COPD exacerbations.  She has tried ibuprofen with improvement.  No aggravating factors.  She took ibuprofen within 6 hours of evaluation.  States that she is unable to sleep at night secondary to the cough.  No calf pain, swelling, hemoptysis, surgery in the past 4 weeks, recent immobilization.  She has a past medical history of COPD and has been admitted for it.  Never intubated.  She also has a history of hypothyroidism, she continues to smoke although states that she is working on quitting.  No history of DVT, PE.  PMD: [facility].    Past Medical History:  Diagnosis Date  . COPD (chronic obstructive pulmonary disease) (HCC)   . Thyroid disease     Past Surgical History:  Procedure Laterality Date  . BREAST CYST ASPIRATION Left 1992  . CESAREAN SECTION    . WRIST GANGLION EXCISION      Family History  Problem Relation Age of Onset  . Dementia Mother   . Cancer Father   . Liver cancer Father   . Breast cancer Neg Hx     Social History   Tobacco Use  . Smoking status: Current Every Day Smoker    Packs/day: 0.25    Years: 40.00    Pack years: 10.00    Types: Cigarettes  . Smokeless tobacco: Never Used  Vaping Use  . Vaping Use: Never used  Substance Use Topics  . Alcohol use: No  . Drug use: Never    No current facility-administered medications for this encounter.  Current Outpatient Medications:  .  albuterol (PROVENTIL HFA;VENTOLIN HFA) 108 (90 BASE) MCG/ACT  inhaler, Inhale into the lungs every 6 (six) hours as needed for wheezing or shortness of breath., Disp: , Rfl:  .  amoxicillin-clavulanate (AUGMENTIN) 875-125 MG tablet, Take 1 tablet by mouth 2 (two) times daily for 7 days., Disp: 14 tablet, Rfl: 0 .  Fluticasone-Salmeterol (ADVAIR) 100-50 MCG/DOSE AEPB, Inhale 1 puff into the lungs 2 (two) times daily., Disp: , Rfl:  .  levothyroxine (SYNTHROID, LEVOTHROID) 50 MCG tablet, Take 75 mcg by mouth daily before breakfast. , Disp: , Rfl:  .  predniSONE (DELTASONE) 20 MG tablet, Take 2 tablets (40 mg total) by mouth daily with breakfast for 5 days., Disp: 10 tablet, Rfl: 0 .  Spacer/Aero-Holding Chambers (AEROCHAMBER PLUS) inhaler, Use with inhaler, Disp: 1 each, Rfl: 2 .  tiotropium (SPIRIVA) 18 MCG inhalation capsule, Place 18 mcg into inhaler and inhale daily., Disp: , Rfl:   Allergies  Allergen Reactions  . Percocet [Oxycodone-Acetaminophen] Hives     ROS  As noted in HPI.   Physical Exam  BP (!) 126/96 (BP Location: Left Arm)   Pulse 72   Temp 98 F (36.7 C) (Oral)   Resp 18   Ht 5\' 5"  (1.651 m)   Wt 42.6 kg   LMP 02/17/2014   SpO2 100%   BMI 15.64 kg/m   Constitutional: Well developed, well nourished, no acute distress  Eyes:  EOMI, conjunctiva normal bilaterally HENT: Normocephalic, atraumatic,mucus membranes moist.  No nasal congestion.  No maxillary, frontal sinus tenderness.  No postnasal drip. Respiratory: Normal inspiratory effort, lungs clear bilaterally, poor air movement.  No anterior lateral chest wall tenderness Cardiovascular: Normal rate and rhythm, no murmurs rubs or gallop GI: nondistended skin: No rash, skin intact Musculoskeletal: Calves symmetric, nontender, no edema. Neurologic: Alert & oriented x 3, no focal neuro deficits Psychiatric: Speech and behavior appropriate   ED Course   Medications - No data to display  No orders of the defined types were placed in this encounter.   No results found  for this or any previous visit (from the past 24 hour(s)). No results found.  ED Clinical Impression  1. COPD exacerbation [facility])      ED Assessment/Plan  Presentation consistent with a COPD exacerbation.  She has been seen here several times for the same, and states that this feels identical to previous exacerbations.  She is satting well room air, no respiratory distress.  Afebrile, but also took a antipyretic within 6 hours of evaluation.  Feel that she is low risk for PE.  She has no evidence of DVT.  Her lungs are clear, doubt pneumonia, thus a chest x-ray was not done.  Will send home with a spacer for her albuterol inhaler.  Instructed her to take 2 puffs of it every 4-6 hours as needed, continue Spiriva, Advair, will send home on prednisone 40 mg for 5 days and Augmentin.  She declined Covid testing.  Follow-up with PMD in several days, ER return precautions given.  Discussed MDM, treatment plan, and plan for follow-up with patient. Discussed sn/sx that should prompt return to the ED. patient agrees with plan.   Meds ordered this encounter  Medications  . amoxicillin-clavulanate (AUGMENTIN) 875-125 MG tablet    Sig: Take 1 tablet by mouth 2 (two) times daily for 7 days.    Dispense:  14 tablet    Refill:  0  . predniSONE (DELTASONE) 20 MG tablet    Sig: Take 2 tablets (40 mg total) by mouth daily with breakfast for 5 days.    Dispense:  10 tablet    Refill:  0  . Spacer/Aero-Holding Chambers (AEROCHAMBER PLUS) inhaler    Sig: Use with inhaler    Dispense:  1 each    Refill:  2    Please educate patient on use    *This clinic note was created using Dragon dictation software. Therefore, there may be occasional mistakes despite careful proofreading.   ?    Domenick Gong, MD 05/19/20 1842

## 2020-05-19 NOTE — Discharge Instructions (Signed)
2 puffs from your albuterol inhaler every 4-6 hours as needed.  Use your spacer that I prescribed.  Apparently I have seen you before and have prescribed spacers, so check and make sure that you do not already have one at home.  Finish the prednisone and the Augmentin, even if you feel better.  Continue working on quitting smoking.  Continue your Spiriva and Advair.  Follow-up with your doctor in several days, go to the ER if you get worse.

## 2020-10-14 ENCOUNTER — Other Ambulatory Visit: Payer: Self-pay

## 2020-10-14 ENCOUNTER — Ambulatory Visit
Admission: RE | Admit: 2020-10-14 | Discharge: 2020-10-14 | Disposition: A | Discharge status: Home / Self Care | Payer: Self-pay | Source: Ambulatory Visit | Attending: Physician Assistant | Admitting: Physician Assistant

## 2020-10-14 VITALS — BP 148/84 | HR 77 | Temp 98.1°F | Resp 16 | Ht 65.0 in | Wt 89.0 lb

## 2020-10-14 DIAGNOSIS — R059 Cough, unspecified: Secondary | ICD-10-CM

## 2020-10-14 DIAGNOSIS — J441 Chronic obstructive pulmonary disease with (acute) exacerbation: Secondary | ICD-10-CM

## 2020-10-14 DIAGNOSIS — B349 Viral infection, unspecified: Secondary | ICD-10-CM

## 2020-10-14 MED ORDER — AZITHROMYCIN 250 MG PO TABS: 250.0000 mg | ORAL_TABLET | Freq: Every day | ORAL | 0 refills | Status: DC

## 2020-10-14 MED ORDER — PREDNISONE 20 MG PO TABS: 20.0000 mg | ORAL_TABLET | Freq: Every day | ORAL | 0 refills | Status: AC

## 2020-10-14 NOTE — Discharge Instructions (Addendum)
As we discussed this is most likely a viral illness.  It is great that you had a negative COVID test at home but I would suggest retesting you.  If you are not tested here today then you should retest her self in about 2 days.  If you develop a fever, worsening cough or breathing difficulty you need to be seen again and definitely consider COVID testing.  You are high risk since you have COPD and also since you have not been vaccinated.  I would definitely encourage you to get vaccinated for COVID-19 given your medical problems.  Purchase over-the-counter Mucinex and increase fluid intake.  You can take ibuprofen and Tylenol for discomfort.  Continue with your inhalers.  I have sent prednisone and azithromycin for you.  I would speak with your primary about potentially referring you to pulmonology as you should not be having multiple exacerbations of your COPD each year.

## 2020-10-14 NOTE — ED Provider Notes (Signed)
[name]    CSN: [id number] Arrival date & time: 10/14/20  1040      History   Chief Complaint Chief Complaint  Patient presents with   Cough    HPI April Sweeney is a 55 y.o. female with history of COPD presenting for 4 to 5-day history of cough that has become productive of thick clear sputum.  She denies any fever, fatigue, chest discomfort or increased breathing difficulty.  Patient reports her symptoms feel like a COPD exacerbation.  She denies any COVID exposure.  Has not been vaccinated for COVID-19.  States she took an at home COVID test which was negative.  Declines COVID testing today.  Of note, patient has been seen multiple times for COPD exacerbations at this urgent care with the last visit in March of this year.  She is also seen in October and December of last year.  Patient treated each time with prednisone and antibiotics.  She states that she uses at home Spiriva and Advair and also uses albuterol as needed.  Patient says her COPD is managed by her primary care provider and she has not seen a pulmonologist.  No other complaints.  HPI  Past Medical History:  Diagnosis Date   COPD (chronic obstructive pulmonary disease) (HCC)    Thyroid disease     There are no problems to display for this patient.   Past Surgical History:  Procedure Laterality Date   BREAST CYST ASPIRATION Left 1992   CESAREAN SECTION     WRIST GANGLION EXCISION      OB History     Gravida  2   Para  2   Term      Preterm      AB      Living         SAB      IAB      Ectopic      Multiple      Live Births               Home Medications    Prior to Admission medications   Medication Sig Start Date End Date Taking? Authorizing Provider  azithromycin (ZITHROMAX) 250 MG tablet Take 1 tablet (250 mg total) by mouth daily. Take first 2 tablets together, then 1 every day until finished. 10/14/20  Yes Shirlee Latch, PA-C  predniSONE (DELTASONE) 20 MG  tablet Take 1 tablet (20 mg total) by mouth daily for 5 days. 10/14/20 10/19/20 Yes Eusebio Friendly B, PA-C  albuterol (PROVENTIL HFA;VENTOLIN HFA) 108 (90 BASE) MCG/ACT inhaler Inhale into the lungs every 6 (six) hours as needed for wheezing or shortness of breath.    [provider]  Fluticasone-Salmeterol (ADVAIR) 100-50 MCG/DOSE AEPB Inhale 1 puff into the lungs 2 (two) times daily.    [provider]  levothyroxine (SYNTHROID, LEVOTHROID) 50 MCG tablet Take 75 mcg by mouth daily before breakfast.     [provider]  Spacer/Aero-Holding Chambers (AEROCHAMBER PLUS) inhaler Use with inhaler 05/19/20   Domenick Gong, MD  tiotropium (SPIRIVA) 18 MCG inhalation capsule Place 18 mcg into inhaler and inhale daily.    [provider]    Family History Family History  Problem Relation Age of Onset   Dementia Mother    Cancer Father    Liver cancer Father    Breast cancer Neg Hx     Social History Social History   Tobacco Use   Smoking status: Every Day  Packs/day: 0.25    Years: 40.00    Pack years: 10.00    Types: Cigarettes   Smokeless tobacco: Never  Vaping Use   Vaping Use: Never used  Substance Use Topics   Alcohol use: No   Drug use: Never     Allergies   Percocet [oxycodone-acetaminophen]   Review of Systems Review of Systems  Constitutional:  Negative for chills, diaphoresis, fatigue and fever.  HENT:  Positive for congestion. Negative for ear pain, rhinorrhea, sinus pressure, sinus pain and sore throat.   Respiratory:  Positive for cough. Negative for shortness of breath.   Cardiovascular:  Negative for chest pain.  Gastrointestinal:  Negative for abdominal pain, nausea and vomiting.  Musculoskeletal:  Negative for arthralgias and myalgias.  Skin:  Negative for rash.  Neurological:  Negative for weakness and headaches.  Hematological:  Negative for adenopathy.    Physical Exam Triage Vital Signs ED Triage Vitals  Enc  Vitals Group     BP 10/14/20 1049 (!) 148/84     Pulse Rate 10/14/20 1049 77     Resp 10/14/20 1049 16     Temp 10/14/20 1049 98.1 F (36.7 C)     Temp Source 10/14/20 1049 Oral     SpO2 10/14/20 1049 99 %     Weight 10/14/20 1047 89 lb (40.4 kg)     Height 10/14/20 1047 5\' 5"  (1.651 m)     Head Circumference --      Peak Flow --      Pain Score 10/14/20 1047 0     Pain Loc --      Pain Edu? --      Excl. in GC? --    No data found.  Updated Vital Signs BP (!) 148/84   Pulse 77   Temp 98.1 F (36.7 C) (Oral)   Resp 16   Ht 5\' 5"  (1.651 m)   Wt 89 lb (40.4 kg)   LMP 02/17/2014   SpO2 99%   BMI 14.81 kg/m      Physical Exam Vitals and nursing note reviewed.  Constitutional:      General: She is not in acute distress.    Appearance: Normal appearance. She is not ill-appearing or toxic-appearing.  HENT:     Head: Normocephalic and atraumatic.     Nose: Nose normal.     Mouth/Throat:     Mouth: Mucous membranes are moist.     Pharynx: Oropharynx is clear.  Eyes:     General: No scleral icterus.       Right eye: No discharge.        Left eye: No discharge.     Conjunctiva/sclera: Conjunctivae normal.  Cardiovascular:     Rate and Rhythm: Normal rate and regular rhythm.     Heart sounds: Normal heart sounds.  Pulmonary:     Effort: Pulmonary effort is normal. No respiratory distress.     Breath sounds: Normal breath sounds.  Musculoskeletal:     Cervical back: Neck supple.  Skin:    General: Skin is dry.  Neurological:     General: No focal deficit present.     Mental Status: She is alert. Mental status is at baseline.     Motor: No weakness.     Gait: Gait normal.  Psychiatric:        Mood and Affect: Mood normal.        Behavior: Behavior normal.        Thought Content: Thought  content normal.     UC Treatments / Results  Labs (all labs ordered are listed, but only abnormal results are displayed) Labs Reviewed - No data to  display  EKG   Radiology No results found.  Procedures Procedures (including critical care time)  Medications Ordered in UC Medications - No data to display  Initial Impression / Assessment and Plan / UC Course  I have reviewed the triage vital signs and the nursing notes.  Pertinent labs & imaging results that were available during my care of the patient were reviewed by me and considered in my medical decision making (see chart for details).  55 year old female presenting for approximately 4 to 5-day history of cough that is productive of thick and clear sputum.  Patient says this is different from her baseline cough.  States she was up all night coughing.  Has taken ibuprofen but nothing for the cough.  No associated fever or breathing difficulty.  As mentioned in HPI, patient has been seen now 4 times for COPD exacerbation in 10 months.  Advised patient symptoms currently are most consistent with a viral illness.  She has normal oxygen saturation and her lungs are clear to auscultation.  In fact, her exam is completely benign.  Advised her to continue with her inhalers.  Since she is treated with prednisone and antibiotics each time she does request that today.  I have sent in 20 mg prednisone x5 days.  Also sent in azithromycin.  Advised her to follow-up with her primary care provider and I would suggest a referral to pulmonology as she should not be having 4 or more exacerbations of her COPD each year.  Also advise she likely does not need antibiotics each time.  Return and ED precautions as well as when to get tested for COVID-19 discussed with patient.   Final Clinical Impressions(s) / UC Diagnoses   Final diagnoses:  COPD exacerbation (HCC)  Viral illness  Cough     Discharge Instructions      As we discussed this is most likely a viral illness.  It is great that you had a negative COVID test at home but I would suggest retesting you.  If you are not tested here today  then you should retest her self in about 2 days.  If you develop a fever, worsening cough or breathing difficulty you need to be seen again and definitely consider COVID testing.  You are high risk since you have COPD and also since you have not been vaccinated.  I would definitely encourage you to get vaccinated for COVID-19 given your medical problems.  Purchase over-the-counter Mucinex and increase fluid intake.  You can take ibuprofen and Tylenol for discomfort.  Continue with your inhalers.  I have sent prednisone and azithromycin for you.  I would speak with your primary about potentially referring you to pulmonology as you should not be having multiple exacerbations of your COPD each year.      ED Prescriptions     Medication Sig Dispense Auth. Provider   azithromycin (ZITHROMAX) 250 MG tablet Take 1 tablet (250 mg total) by mouth daily. Take first 2 tablets together, then 1 every day until finished. 6 tablet Eusebio Friendly B, PA-C   predniSONE (DELTASONE) 20 MG tablet Take 1 tablet (20 mg total) by mouth daily for 5 days. 5 tablet Gareth Morgan      PDMP not reviewed this encounter.   Shirlee Latch, PA-C 10/14/20 1118

## 2020-10-14 NOTE — ED Triage Notes (Signed)
Started Monday with "tickle" in throat, then nasal congestion and cough. Coughing getting worse and productive of clear phlegm. No fever. No pain today

## 2020-10-26 IMAGING — CR DG CHEST 2V
2 series · 2 of 2 positions shown · non-contrast
Comparison: 05/26/2017.  Chest CT 02/19/2012

CLINICAL DATA: 53-year-old with cough.  Shortness of breath.  COPD.

EXAM:
CHEST - 2 VIEW

[chest pa]
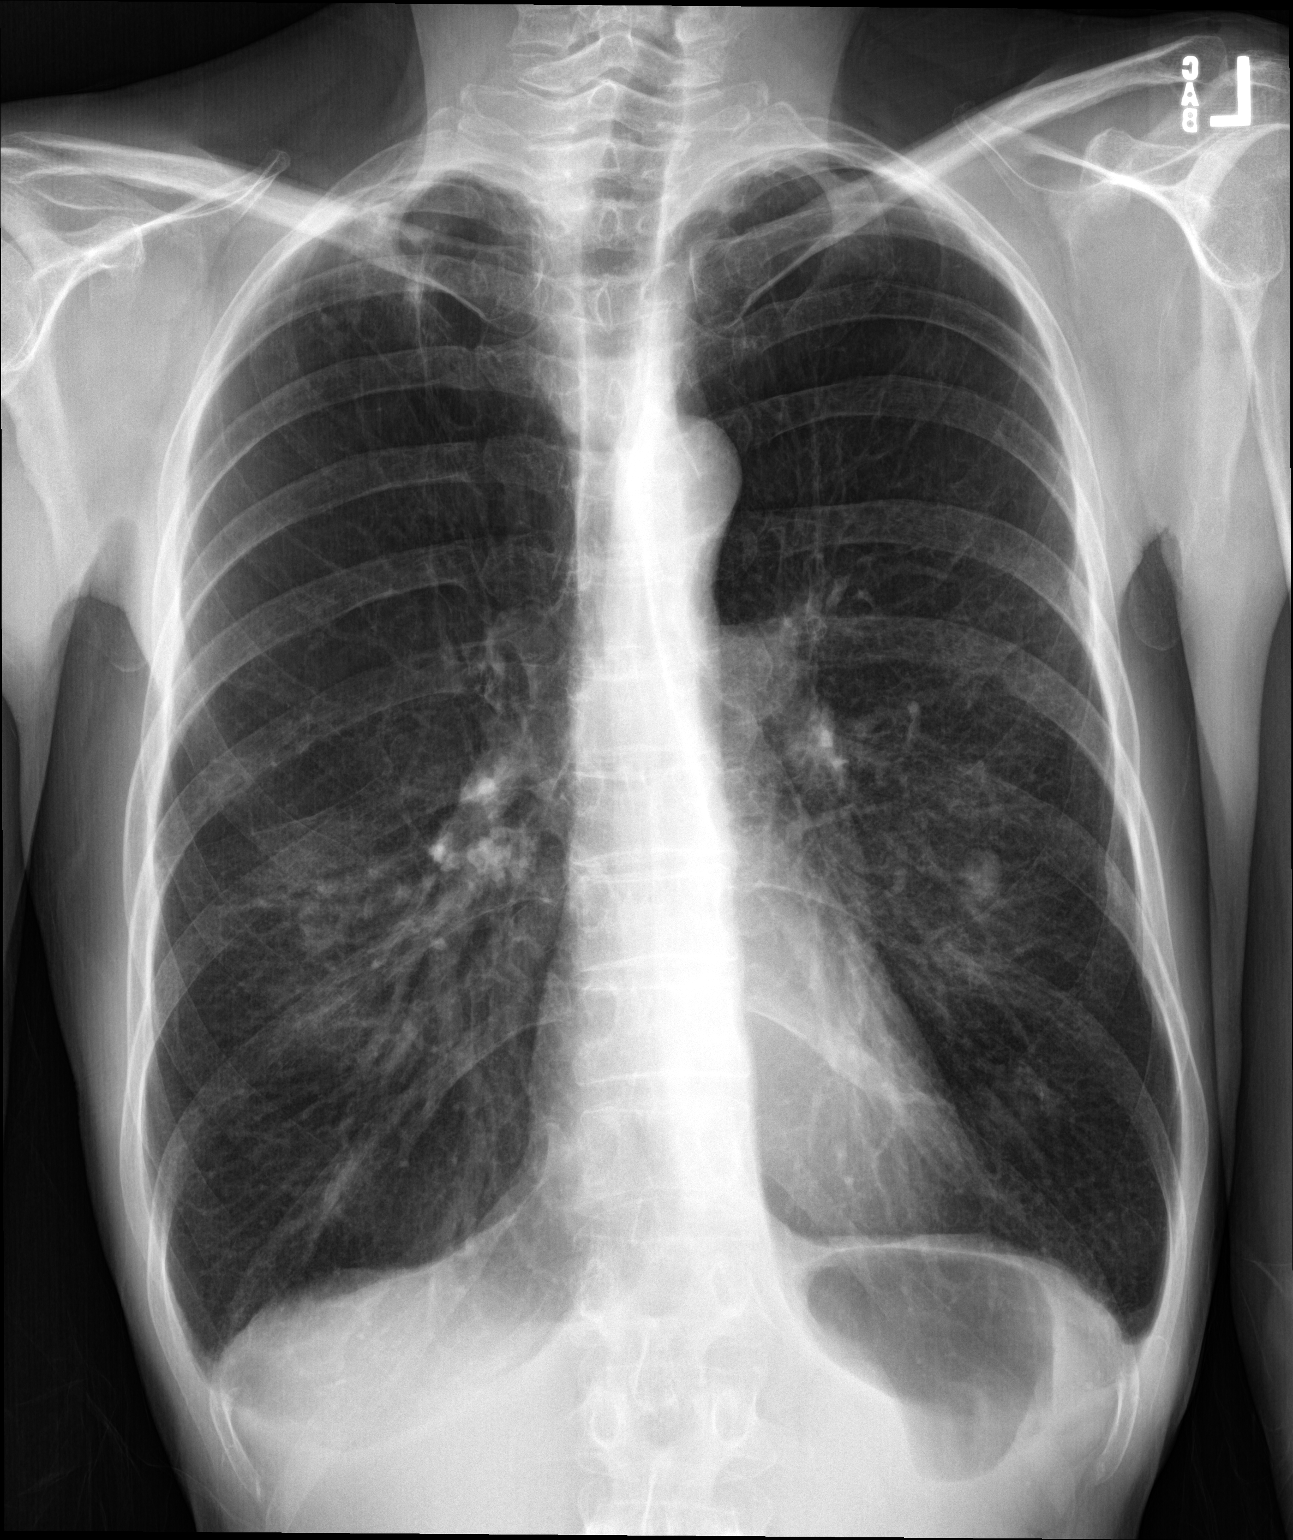

[chest lat]
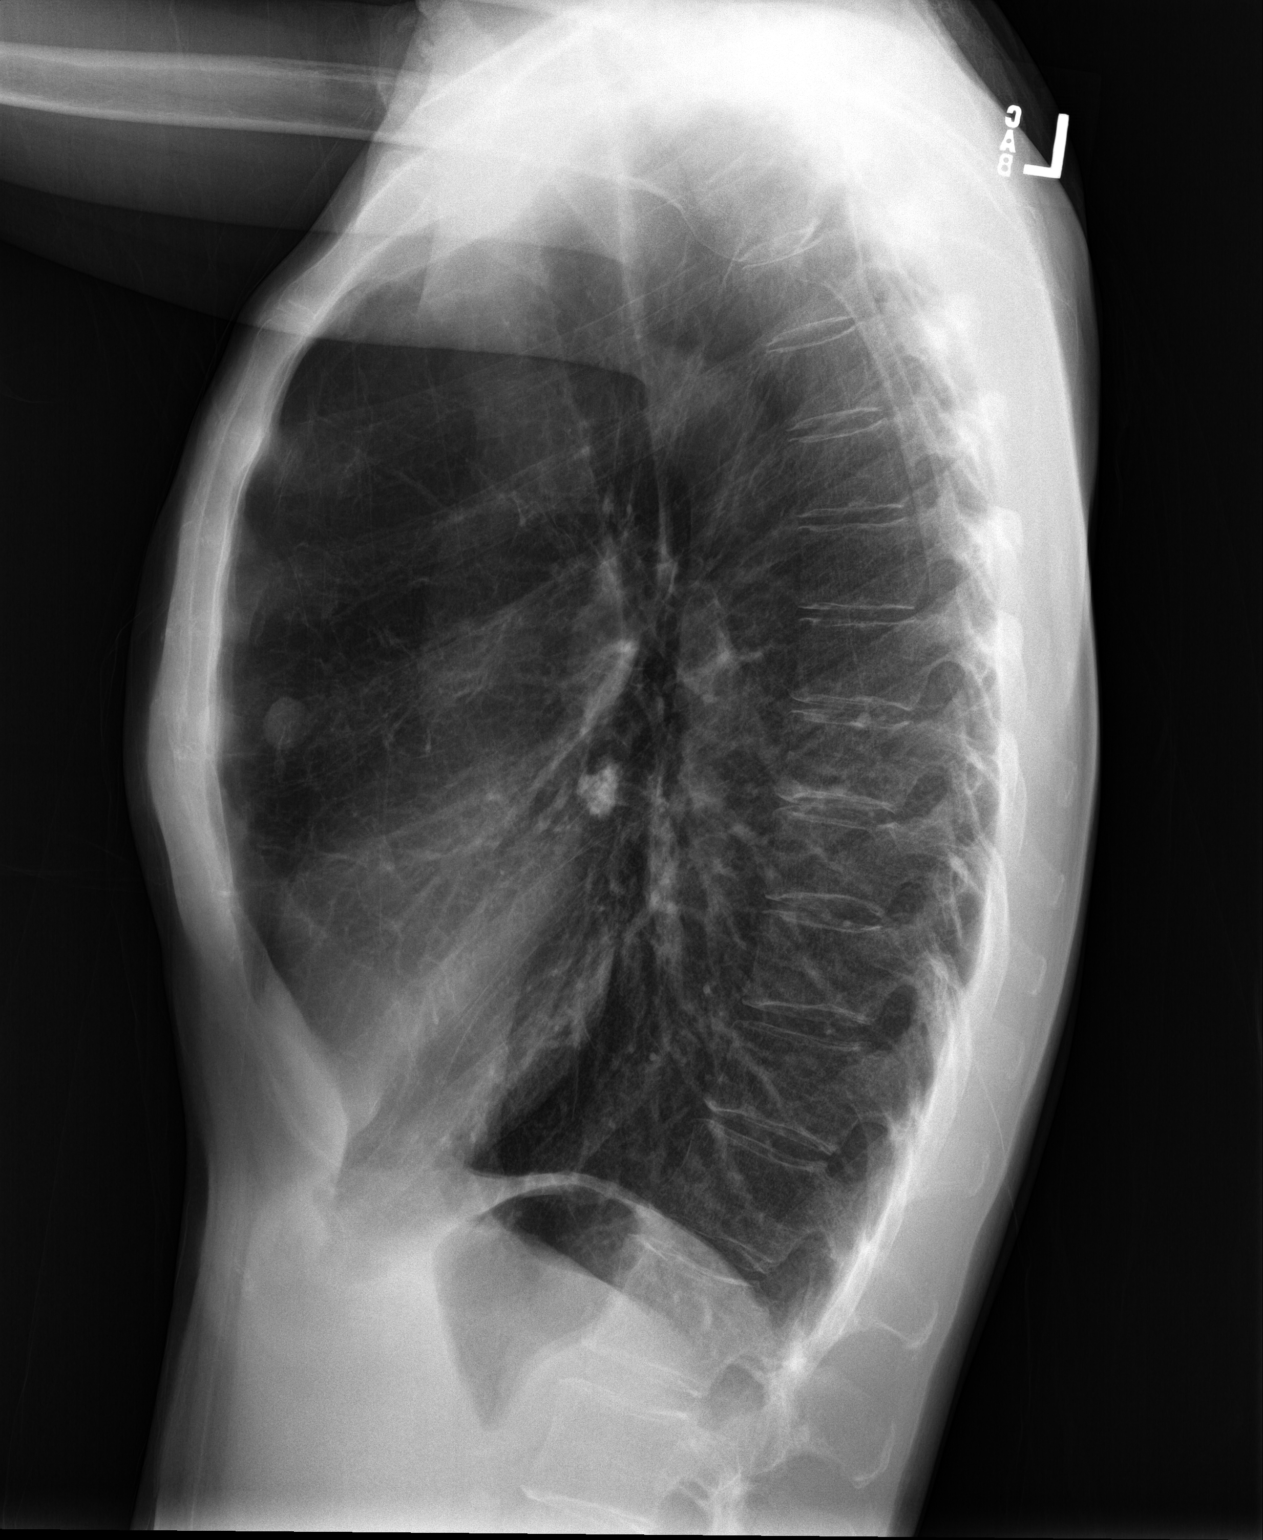

[2 of 2 positions shown; findings below may reference images not displayed]

FINDINGS: Again noted is hyperinflation and changes compatible with emphysema.
Nodular structures on both sides of the chest are most compatible
with nipple shadows and similar to the previous examination.
Scarring at the apices, right side greater than left. No focal
airspace disease or pulmonary edema. Stable calcifications in the
right hilum. Heart and mediastinum are within normal limits. No
large pleural effusions.
IMPRESSION: No active cardiopulmonary disease.

Hyperinflation and emphysema.

Scarring at the lung apices.

## 2020-11-28 ENCOUNTER — Other Ambulatory Visit: Payer: Self-pay

## 2020-11-28 ENCOUNTER — Emergency Department: Payer: Self-pay

## 2020-11-28 ENCOUNTER — Emergency Department
Admission: EM | Admit: 2020-11-28 | Discharge: 2020-11-28 | Disposition: A | Discharge status: Home / Self Care | Payer: Self-pay | Attending: Emergency Medicine | Admitting: Emergency Medicine

## 2020-11-28 DIAGNOSIS — J441 Chronic obstructive pulmonary disease with (acute) exacerbation: Secondary | ICD-10-CM

## 2020-11-28 DIAGNOSIS — Z7951 Long term (current) use of inhaled steroids: Secondary | ICD-10-CM | POA: Insufficient documentation

## 2020-11-28 DIAGNOSIS — J439 Emphysema, unspecified: Secondary | ICD-10-CM | POA: Insufficient documentation

## 2020-11-28 DIAGNOSIS — F17210 Nicotine dependence, cigarettes, uncomplicated: Secondary | ICD-10-CM | POA: Insufficient documentation

## 2020-11-28 MED ORDER — PREDNISONE 50 MG PO TABS: 50.0000 mg | ORAL_TABLET | Freq: Every day | ORAL | 0 refills | Status: DC

## 2020-11-28 MED ORDER — AZITHROMYCIN 250 MG PO TABS: ORAL_TABLET | ORAL | 0 refills | Status: DC

## 2020-11-28 MED ORDER — PREDNISONE 20 MG PO TABS
60.0000 mg | ORAL_TABLET | Freq: Once | ORAL | Status: AC
  Administered 2020-11-28: 60 mg via ORAL
  Filled 2020-11-28: qty 3

## 2020-11-28 MED ORDER — BENZONATATE 100 MG PO CAPS: 100.0000 mg | ORAL_CAPSULE | Freq: Three times a day (TID) | ORAL | 0 refills | Status: DC | PRN

## 2020-11-28 NOTE — ED Provider Notes (Signed)
Emergency Medicine Provider Triage Evaluation Note  April Sweeney , a 55 y.o. female  was evaluated in triage.  Pt complains of productive cough with a history of COPD. She has been using her regular inhalers and has not needed her rescue inhaler. She denies hemoptysis, but notes clear sputum.  Review of Systems  Positive: Productive cough Negative: CP, SOB  Physical Exam  BP 130/83   Pulse 93   Temp 98.3 F (36.8 C) (Oral)   Resp 20   Ht 5\' 5"  (1.651 m)   LMP 02/17/2014   SpO2 97%   BMI 14.81 kg/m  Gen:   Awake, no distress  NAD Resp:  Normal effort CTA MSK:   Moves extremities without difficulty  Other:  CVS: RRR  Medical Decision Making  Medically screening exam initiated at 5:49 PM.  Appropriate orders placed.  April Sweeney was informed that the remainder of the evaluation will be completed by another provider, this initial triage assessment does not replace that evaluation, and the importance of remaining in the ED until their evaluation is complete.  Patient with ED evaluation of productive cough since August.    September, Lissa Hoard 11/28/20 1752    11/30/20, MD 11/29/20 [phone]

## 2020-11-28 NOTE — ED Provider Notes (Signed)
[facility] Emergency Department Provider Note  ____________________________________________  Time seen: Approximately 7:36 PM  I have reviewed the triage vital signs and the nursing notes.   HISTORY  Chief Complaint Cough    HPI April Sweeney is a 55 y.o. female who presents emergency department for ongoing cough.  Patient states that she has COPD, has chronic cough for over same.  She had COVID with slightly worsening symptoms but states that overall her COVID infection was very mild.  This occurred at the beginning of August, almost 2 months ago.  Patient has had an increased cough since her COVID infection and presents tonight for evaluation.  She does have some clear sputum production but denies any purulent production.  No fevers or chills, nasal congestion, sore throat.  Again increased cough the patient does not feel short of breath.  She states that she is taking her daily inhalers and rarely has to use her rescue inhaler.       Past Medical History:  Diagnosis Date   COPD (chronic obstructive pulmonary disease) (HCC)    Thyroid disease     There are no problems to display for this patient.   Past Surgical History:  Procedure Laterality Date   BREAST CYST ASPIRATION Left 1992   CESAREAN SECTION     WRIST GANGLION EXCISION      Prior to Admission medications   Medication Sig Start Date End Date Taking? Authorizing Provider  azithromycin (ZITHROMAX Z-PAK) 250 MG tablet Take 2 tablets (500 mg) on  Day 1,  followed by 1 tablet (250 mg) once daily on Days 2 through 5. 11/28/20  Yes Rhonin Trott, Delorise Royals, PA-C  benzonatate (TESSALON PERLES) 100 MG capsule Take 1 capsule (100 mg total) by mouth 3 (three) times daily as needed for cough. 11/28/20 11/28/21 Yes Bradford Cazier, Delorise Royals, PA-C  predniSONE (DELTASONE) 50 MG tablet Take 1 tablet (50 mg total) by mouth daily with breakfast. 11/28/20  Yes Joseph Bias, Delorise Royals, PA-C  albuterol (PROVENTIL  HFA;VENTOLIN HFA) 108 (90 BASE) MCG/ACT inhaler Inhale into the lungs every 6 (six) hours as needed for wheezing or shortness of breath.    [provider]  Fluticasone-Salmeterol (ADVAIR) 100-50 MCG/DOSE AEPB Inhale 1 puff into the lungs 2 (two) times daily.    [provider]  levothyroxine (SYNTHROID, LEVOTHROID) 50 MCG tablet Take 75 mcg by mouth daily before breakfast.     [provider]  Spacer/Aero-Holding Chambers (AEROCHAMBER PLUS) inhaler Use with inhaler 05/19/20   Domenick Gong, MD  tiotropium (SPIRIVA) 18 MCG inhalation capsule Place 18 mcg into inhaler and inhale daily.    [provider]    Allergies Percocet [oxycodone-acetaminophen]  Family History  Problem Relation Age of Onset   Dementia Mother    Cancer Father    Liver cancer Father    Breast cancer Neg Hx     Social History Social History   Tobacco Use   Smoking status: Every Day    Packs/day: 0.25    Years: 40.00    Pack years: 10.00    Types: Cigarettes   Smokeless tobacco: Never  Vaping Use   Vaping Use: Never used  Substance Use Topics   Alcohol use: No   Drug use: Never     Review of Systems  Constitutional: No fever/chills Eyes: No visual changes. No discharge ENT: No upper respiratory complaints. Cardiovascular: no chest pain. Respiratory: Slightly increased chronic cough. No SOB. Gastrointestinal: No abdominal pain.  No nausea, no vomiting.  No diarrhea.  No constipation. Musculoskeletal: Negative for musculoskeletal pain. Skin: Negative for rash, abrasions, lacerations, ecchymosis. Neurological: Negative for headaches, focal weakness or numbness.  10 System ROS otherwise negative.  ____________________________________________   PHYSICAL EXAM:  VITAL SIGNS: ED Triage Vitals  Enc Vitals Group     BP 11/28/20 1747 130/83     Pulse Rate 11/28/20 1747 93     Resp 11/28/20 1747 20     Temp 11/28/20 1747 98.3 F (36.8 C)     Temp Source  11/28/20 1747 Oral     SpO2 11/28/20 1747 97 %     Weight --      Height 11/28/20 1748 5\' 5"  (1.651 m)     Head Circumference --      Peak Flow --      Pain Score 11/28/20 1747 2     Pain Loc --      Pain Edu? --      Excl. in GC? --      Constitutional: Alert and oriented. Well appearing and in no acute distress. Eyes: Conjunctivae are normal. PERRL. EOMI. Head: Atraumatic. ENT:      Ears:       Nose: No congestion/rhinnorhea.      Mouth/Throat: Mucous membranes are moist.  Neck: No stridor.    Cardiovascular: Normal rate, regular rhythm. Normal S1 and S2.  Good peripheral circulation. Respiratory: Normal respiratory effort without tachypnea or retractions. Lungs CTAB specifically no wheezing, rales or rhonchi. Good air entry to the bases with no decreased or absent breath sounds. Gastrointestinal: Bowel sounds 4 quadrants. Soft and nontender to palpation. No guarding or rigidity. No palpable masses. No distention. No CVA tenderness. Musculoskeletal: Full range of motion to all extremities. No gross deformities appreciated. Neurologic:  Normal speech and language. No gross focal neurologic deficits are appreciated.  Skin:  Skin is warm, dry and intact. No rash noted. Psychiatric: Mood and affect are normal. Speech and behavior are normal. Patient exhibits appropriate insight and judgement.   ____________________________________________   LABS (all labs ordered are listed, but only abnormal results are displayed)  Labs Reviewed - No data to display ____________________________________________  EKG   ____________________________________________  RADIOLOGY I personally viewed and evaluated these images as part of my medical decision making, as well as reviewing the written report by the radiologist.  ED Provider Interpretation: No acute consolidation concerning for lobar pneumonia.  Patient has findings consistent with emphysema and COPD.  Patient also has pulmonary  nodules as described below  DG Chest 2 View  Result Date: 11/28/2020 CLINICAL DATA:  Productive cough EXAM: CHEST - 2 VIEW COMPARISON:  12/25/2019 FINDINGS: Cystic changes noted within the lung apices in the lungs are markedly hyperinflated in keeping with changes of advanced emphysema. Nodular densities within the mid lung zones bilaterally are in keeping with nipple shadows. Nodular density at the right apex overlying the second rib anteriorly demonstrates interval increase in size since prior examination and was not present on remote prior examination of 12/22/2018 is suspicious for a slowly enlarging pulmonary nodule. No superimposed confluent pulmonary infiltrate. No pneumothorax or pleural effusion. Cardiac size within normal limits. No acute bone abnormality. Subacute right sixth rib fracture is seen anteriorly. IMPRESSION: No radiographic evidence of acute cardiopulmonary disease. Severe emphysema. Enlarging nodular opacity at the right apex suspicious for an enlarging pulmonary nodule. Dedicated nonemergent CT examination is recommended for further evaluation. Electronically Signed   By: 12/24/2018 M.D.   On: 11/28/2020 19:56  ____________________________________________    PROCEDURES  Procedure(s) performed:    Procedures    Medications  predniSONE (DELTASONE) tablet 60 mg (has no administration in time range)     ____________________________________________   INITIAL IMPRESSION / ASSESSMENT AND PLAN / ED COURSE  Pertinent labs & imaging results that were available during my care of the patient were reviewed by me and considered in my medical decision making (see chart for details).  Review of the [place] CSRS was performed in accordance of the [facility] prior to dispensing any controlled drugs.           Patient's diagnosis is consistent with COPD, emphysema.  Patient presents emergency department with worsening chronic cough.  She has COPD and has a chronic cough.  She  had COVID 2 months ago and has noticed increased coughing since.  She has clear sputum production but no hemoptysis or purulent production.  No shortness of breath.  Patient has no consolidation on chest x-ray concerning for pneumonia.  I discussed the results with the patient.  Patient is agreeable to plan of treatment with at home oral steroids, azithromycin and using her rescue inhaler every couple of hours for the next several days.  The patient has changes symptoms, worsening of symptoms to include shortness of breath, fevers or chills, chest pain she should return to the emergency department.  I informed the patient of the findings regarding the pulmonary nodules and recommended follow-up with primary care for nonemergent CT scan..  Patient will follow-up with primary care at this time.  Patient is given ED precautions to return to the ED for any worsening or new symptoms.     ____________________________________________  FINAL CLINICAL IMPRESSION(S) / ED DIAGNOSES  Final diagnoses:  Pulmonary emphysema, unspecified emphysema type (HCC)  Chronic obstructive pulmonary disease with acute exacerbation (HCC)      NEW MEDICATIONS STARTED DURING THIS VISIT:  ED Discharge Orders          Ordered    predniSONE (DELTASONE) 50 MG tablet  Daily with breakfast        11/28/20 2029    azithromycin (ZITHROMAX Z-PAK) 250 MG tablet        11/28/20 2029    benzonatate (TESSALON PERLES) 100 MG capsule  3 times daily PRN        11/28/20 2029                This chart was dictated using voice recognition software/Dragon. Despite best efforts to proofread, errors can occur which can change the meaning. Any change was purely unintentional.    Racheal Patches, PA-C 11/28/20 2030    Shaune Pollack, MD 11/30/20 [phone]

## 2020-11-28 NOTE — ED Triage Notes (Signed)
Pt to ER via [facility] with complaints of productive cough since august. Reports having covid in the beginning of august and cough has never subsided. Denies chest pain or SHOB. Hx of COPD. Reports mild back pain associated with coughing. Has been using prescribed inhalers, has not required rescue inhaler. Denies fevers.

## 2020-12-28 ENCOUNTER — Encounter (HOSPITAL_COMMUNITY): Payer: Self-pay | Admitting: Radiology

## 2021-02-08 ENCOUNTER — Other Ambulatory Visit: Payer: Self-pay | Admitting: Student

## 2021-02-08 DIAGNOSIS — R911 Solitary pulmonary nodule: Secondary | ICD-10-CM

## 2021-02-13 ENCOUNTER — Emergency Department: Payer: Self-pay

## 2021-02-13 ENCOUNTER — Other Ambulatory Visit: Payer: Self-pay

## 2021-02-13 ENCOUNTER — Inpatient Hospital Stay
Admission: EM | Admit: 2021-02-13 | Discharge: 2021-02-15 | DRG: 193 | Disposition: A | Discharge status: Home / Self Care | Payer: Self-pay | Attending: Obstetrics and Gynecology | Admitting: Obstetrics and Gynecology

## 2021-02-13 DIAGNOSIS — R0902 Hypoxemia: Secondary | ICD-10-CM

## 2021-02-13 DIAGNOSIS — Z800 Family history of malignant neoplasm of digestive organs: Secondary | ICD-10-CM

## 2021-02-13 DIAGNOSIS — F17210 Nicotine dependence, cigarettes, uncomplicated: Secondary | ICD-10-CM | POA: Diagnosis present

## 2021-02-13 DIAGNOSIS — J449 Chronic obstructive pulmonary disease, unspecified: Secondary | ICD-10-CM

## 2021-02-13 DIAGNOSIS — Z79899 Other long term (current) drug therapy: Secondary | ICD-10-CM

## 2021-02-13 DIAGNOSIS — Z79890 Hormone replacement therapy (postmenopausal): Secondary | ICD-10-CM

## 2021-02-13 DIAGNOSIS — I10 Essential (primary) hypertension: Secondary | ICD-10-CM

## 2021-02-13 DIAGNOSIS — Z8616 Personal history of COVID-19: Secondary | ICD-10-CM

## 2021-02-13 DIAGNOSIS — R911 Solitary pulmonary nodule: Secondary | ICD-10-CM

## 2021-02-13 DIAGNOSIS — J9601 Acute respiratory failure with hypoxia: Secondary | ICD-10-CM | POA: Diagnosis present

## 2021-02-13 DIAGNOSIS — R918 Other nonspecific abnormal finding of lung field: Secondary | ICD-10-CM

## 2021-02-13 DIAGNOSIS — J101 Influenza due to other identified influenza virus with other respiratory manifestations: Secondary | ICD-10-CM

## 2021-02-13 DIAGNOSIS — Z7951 Long term (current) use of inhaled steroids: Secondary | ICD-10-CM

## 2021-02-13 DIAGNOSIS — D72818 Other decreased white blood cell count: Secondary | ICD-10-CM | POA: Diagnosis present

## 2021-02-13 DIAGNOSIS — J441 Chronic obstructive pulmonary disease with (acute) exacerbation: Principal | ICD-10-CM | POA: Diagnosis present

## 2021-02-13 DIAGNOSIS — E039 Hypothyroidism, unspecified: Secondary | ICD-10-CM

## 2021-02-13 HISTORY — DX: Influenza due to other identified influenza virus with other respiratory manifestations: J10.1

## 2021-02-13 LAB — PROCALCITONIN: Procalcitonin: <0.1 ng/mL

## 2021-02-13 LAB — CBC WITH DIFFERENTIAL/PLATELET
Abs Immature Granulocytes: 0.01 10*3/uL (ref 0.00–0.07)
Basophils Absolute: 0 10*3/uL (ref 0.0–0.1)
Basophils Relative: 1 %
Eosinophils Absolute: 0 10*3/uL (ref 0.0–0.5)
Eosinophils Relative: 1 %
HCT: 42.4 % (ref 36.0–46.0)
Hemoglobin: 14.1 g/dL (ref 12.0–15.0)
Immature Granulocytes: 0 %
Lymphocytes Relative: 36 %
Lymphs Abs: 1 10*3/uL (ref 0.7–4.0)
MCH: 32.6 pg (ref 26.0–34.0)
MCHC: 33.3 g/dL (ref 30.0–36.0)
MCV: 97.9 fL (ref 80.0–100.0)
Monocytes Absolute: 0.4 10*3/uL (ref 0.1–1.0)
Monocytes Relative: 15 %
Neutro Abs: 1.3 10*3/uL — ABNORMAL LOW (ref 1.7–7.7)
Neutrophils Relative %: 47 %
Platelets: 154 10*3/uL (ref 150–400)
RBC: 4.33 MIL/uL (ref 3.87–5.11)
RDW: 11.9 % (ref 11.5–15.5)
WBC: 2.8 10*3/uL — ABNORMAL LOW (ref 4.0–10.5)
nRBC: 0 % (ref 0.0–0.2)

## 2021-02-13 LAB — RESP PANEL BY RT-PCR (FLU A&B, COVID) ARPGX2
Influenza A by PCR: POSITIVE — AB
Influenza B by PCR: NEGATIVE
SARS Coronavirus 2 by RT PCR: NEGATIVE

## 2021-02-13 LAB — BASIC METABOLIC PANEL
Anion gap: 9 (ref 5–15)
BUN: <5 mg/dL — ABNORMAL LOW (ref 6–20)
CO2: 27 mmol/L (ref 22–32)
Calcium: 8.8 mg/dL — ABNORMAL LOW (ref 8.9–10.3)
Chloride: 99 mmol/L (ref 98–111)
Creatinine, Ser: 0.53 mg/dL (ref 0.44–1.00)
GFR, Estimated: >60 mL/min (ref >60)
Glucose, Bld: 107 mg/dL — ABNORMAL HIGH (ref 70–99)
Potassium: 3.8 mmol/L (ref 3.5–5.1)
Sodium: 135 mmol/L (ref 135–145)

## 2021-02-13 LAB — EXPECTORATED SPUTUM ASSESSMENT W GRAM STAIN, RFLX TO RESP C

## 2021-02-13 LAB — TROPONIN I (HIGH SENSITIVITY): Troponin I (High Sensitivity): 5 ng/L (ref <18)

## 2021-02-13 MED ORDER — SODIUM CHLORIDE 0.9% FLUSH: 3.0000 mL | INTRAVENOUS | Status: DC | PRN

## 2021-02-13 MED ORDER — PREDNISONE 20 MG PO TABS
40.0000 mg | ORAL_TABLET | Freq: Every day | ORAL | Status: DC
  Administered 2021-02-13 – 2021-02-15 (×3): 40 mg via ORAL
  Filled 2021-02-13 (×3): qty 2

## 2021-02-13 MED ORDER — FLUTICASONE FUROATE-VILANTEROL 200-25 MCG/ACT IN AEPB
1.0000 | INHALATION_SPRAY | Freq: Every day | RESPIRATORY_TRACT | Status: DC
  Administered 2021-02-13 – 2021-02-15 (×3): 1 via RESPIRATORY_TRACT
  Filled 2021-02-13: qty 28

## 2021-02-13 MED ORDER — LEVOTHYROXINE SODIUM 50 MCG PO TABS
75.0000 ug | ORAL_TABLET | Freq: Every day | ORAL | Status: DC
  Administered 2021-02-14 – 2021-02-15 (×2): 75 ug via ORAL
  Filled 2021-02-13 (×2): qty 1

## 2021-02-13 MED ORDER — SODIUM CHLORIDE 0.9 % IV SOLN: 250.0000 mL | INTRAVENOUS | Status: DC | PRN

## 2021-02-13 MED ORDER — IPRATROPIUM-ALBUTEROL 0.5-2.5 (3) MG/3ML IN SOLN
3.0000 mL | Freq: Once | RESPIRATORY_TRACT | Status: AC
  Administered 2021-02-13: 3 mL via RESPIRATORY_TRACT
  Filled 2021-02-13: qty 3

## 2021-02-13 MED ORDER — IPRATROPIUM-ALBUTEROL 0.5-2.5 (3) MG/3ML IN SOLN
3.0000 mL | Freq: Four times a day (QID) | RESPIRATORY_TRACT | Status: DC
Start: 2021-02-13 — End: 2021-02-14
  Administered 2021-02-13 – 2021-02-14 (×5): 3 mL via RESPIRATORY_TRACT
  Filled 2021-02-13 (×5): qty 3

## 2021-02-13 MED ORDER — OSELTAMIVIR PHOSPHATE 75 MG PO CAPS
75.0000 mg | ORAL_CAPSULE | Freq: Two times a day (BID) | ORAL | Status: DC
  Administered 2021-02-13 – 2021-02-15 (×5): 75 mg via ORAL
  Filled 2021-02-13 (×6): qty 1

## 2021-02-13 MED ORDER — SODIUM CHLORIDE 0.9 % IV SOLN
500.0000 mg | Freq: Once | INTRAVENOUS | Status: AC
  Administered 2021-02-13: 500 mg via INTRAVENOUS
  Filled 2021-02-13: qty 5

## 2021-02-13 MED ORDER — SODIUM CHLORIDE 0.9% FLUSH
3.0000 mL | Freq: Two times a day (BID) | INTRAVENOUS | Status: DC
  Administered 2021-02-13: 3 mL via INTRAVENOUS

## 2021-02-13 MED ORDER — ENOXAPARIN SODIUM 30 MG/0.3ML IJ SOSY
30.0000 mg | PREFILLED_SYRINGE | INTRAMUSCULAR | Status: DC
  Administered 2021-02-13 – 2021-02-14 (×2): 30 mg via SUBCUTANEOUS
  Filled 2021-02-13 (×2): qty 0.3

## 2021-02-13 MED ORDER — ALBUTEROL SULFATE (2.5 MG/3ML) 0.083% IN NEBU: 2.5000 mg | INHALATION_SOLUTION | RESPIRATORY_TRACT | Status: DC | PRN

## 2021-02-13 MED ORDER — AZITHROMYCIN 500 MG PO TABS
500.0000 mg | ORAL_TABLET | Freq: Every day | ORAL | Status: AC
  Administered 2021-02-14 – 2021-02-15 (×2): 500 mg via ORAL
  Filled 2021-02-13 (×2): qty 1

## 2021-02-13 MED ORDER — SODIUM CHLORIDE 0.9 % IV SOLN
1.0000 g | Freq: Once | INTRAVENOUS | Status: AC
  Administered 2021-02-13: 1 g via INTRAVENOUS
  Filled 2021-02-13: qty 10

## 2021-02-13 MED ORDER — IBUPROFEN 400 MG PO TABS: 400.0000 mg | ORAL_TABLET | Freq: Four times a day (QID) | ORAL | Status: DC | PRN

## 2021-02-13 NOTE — H&P (Signed)
History and Physical    NATAYLA CADENHEAD HGD:[id number] DOB: 09-12-1965 DOA: 02/13/2021  PCP: [facility]  Patient coming from: home   Chief Complaint: cough, fatighue  HPI: CARREN BLAKLEY is a 55 y.o. female with medical history significant for copd, htn, hypothyroid, who presents with the above.  Says has been feeling poorly with productive cough since covid in August. Symptoms never really improved but for past several days increased fatigue and body aches. Cough is somewhat productive. No fevers or chills. No n/v/d. No chest pain. Tolerating diet. No recent antibiotics. Has pet scan scheduled for Wednesday. Says has home o2 monitor and typical for her is 96%.  ED Course:   Labs, imaging, abx. O2 88%  Review of Systems: As per HPI otherwise 10 point review of systems negative.    Past Medical History:  Diagnosis Date   COPD (chronic obstructive pulmonary disease) (HCC)    Thyroid disease     Past Surgical History:  Procedure Laterality Date   BREAST CYST ASPIRATION Left 1992   CESAREAN SECTION     WRIST GANGLION EXCISION       reports that she has been smoking cigarettes. She has a 10.00 pack-year smoking history. She has never used smokeless tobacco. She reports that she does not drink alcohol and does not use drugs.  Allergies  Allergen Reactions   Percocet [Oxycodone-Acetaminophen] Hives    Family History  Problem Relation Age of Onset   Dementia Mother    Cancer Father    Liver cancer Father    Breast cancer Neg Hx     Prior to Admission medications   Medication Sig Start Date End Date Taking? Authorizing Provider  amLODipine (NORVASC) 5 MG tablet Take 5 mg by mouth daily.   Yes [provider]  albuterol (PROVENTIL HFA;VENTOLIN HFA) 108 (90 BASE) MCG/ACT inhaler Inhale into the lungs every 6 (six) hours as needed for wheezing or shortness of breath.    [provider]  azithromycin (ZITHROMAX Z-PAK) 250 MG tablet Take 2  tablets (500 mg) on  Day 1,  followed by 1 tablet (250 mg) once daily on Days 2 through 5. 11/28/20   Cuthriell, Delorise Royals, PA-C  benzonatate (TESSALON PERLES) 100 MG capsule Take 1 capsule (100 mg total) by mouth 3 (three) times daily as needed for cough. 11/28/20 11/28/21  Cuthriell, Delorise Royals, PA-C  fluticasone-salmeterol (ADVAIR) 250-50 MCG/ACT AEPB Inhale 1 puff into the lungs 2 (two) times daily.    [provider]  levothyroxine (SYNTHROID) 88 MCG tablet Take 75 mcg by mouth daily before breakfast.     [provider]  Spacer/Aero-Holding Chambers (AEROCHAMBER PLUS) inhaler Use with inhaler 05/19/20   Domenick Gong, MD  tiotropium (SPIRIVA) 18 MCG inhalation capsule Place 18 mcg into inhaler and inhale daily.    [provider]    Physical Exam: Vitals:   02/13/21 0926 02/13/21 1000 02/13/21 1030 02/13/21 1100  BP:  128/83 116/83 114/69  Pulse:  93 89 88  Resp:  18 16 15   Temp:      SpO2:  98% 97% 91%  Weight: 37.2 kg     Height: 5\' 5"  (1.651 m)       Constitutional: No acute distress, cachectic Head: Atraumatic Eyes: Conjunctiva clear ENM: Moist mucous membranes. Normal dentition.  Neck: Supple Respiratory: scattered rhonchi, faint exp wheeze Cardiovascular: Regular rate and rhythm. No murmurs/rubs/gallops. Abdomen: Non-tender, non-distended. No masses. No rebound or guarding. Positive bowel sounds. Musculoskeletal: No  joint deformity upper and lower extremities. Normal ROM, no contractures. Normal muscle tone.  Skin: No rashes, lesions, or ulcers.  Extremities: No peripheral edema. Palpable peripheral pulses. Neurologic: Alert, moving all 4 extremities. Psychiatric: Normal insight and judgement.   Labs on Admission: I have personally reviewed following labs and imaging studies  CBC: Recent Labs  Lab 02/13/21 1004  WBC 2.8*  NEUTROABS 1.3*  HGB 14.1  HCT 42.4  MCV 97.9  PLT 154   Basic Metabolic Panel: Recent Labs  Lab  02/13/21 1004  NA 135  K 3.8  CL 99  CO2 27  GLUCOSE 107*  BUN <5*  CREATININE 0.53  CALCIUM 8.8*   GFR: Estimated Creatinine Clearance: 46.7 mL/min (by C-G formula based on SCr of 0.53 mg/dL). Liver Function Tests: No results for input(s): AST, ALT, ALKPHOS, BILITOT, PROT, ALBUMIN in the last 168 hours. No results for input(s): LIPASE, AMYLASE in the last 168 hours. No results for input(s): AMMONIA in the last 168 hours. Coagulation Profile: No results for input(s): INR, PROTIME in the last 168 hours. Cardiac Enzymes: No results for input(s): CKTOTAL, CKMB, CKMBINDEX, TROPONINI in the last 168 hours. BNP (last 3 results) No results for input(s): PROBNP in the last 8760 hours. HbA1C: No results for input(s): HGBA1C in the last 72 hours. CBG: No results for input(s): GLUCAP in the last 168 hours. Lipid Profile: No results for input(s): CHOL, HDL, LDLCALC, TRIG, CHOLHDL, LDLDIRECT in the last 72 hours. Thyroid Function Tests: No results for input(s): TSH, T4TOTAL, FREET4, T3FREE, THYROIDAB in the last 72 hours. Anemia Panel: No results for input(s): VITAMINB12, FOLATE, FERRITIN, TIBC, IRON, RETICCTPCT in the last 72 hours. Urine analysis:    Component Value Date/Time   COLORURINE YELLOW 04/29/2015 1028   APPEARANCEUR CLEAR 04/29/2015 1028   LABSPEC <1.005 (L) 04/29/2015 1028   PHURINE 6.0 04/29/2015 1028   GLUCOSEU NEGATIVE 04/29/2015 1028   HGBUR NEGATIVE 04/29/2015 1028   BILIRUBINUR NEGATIVE 04/29/2015 1028   KETONESUR NEGATIVE 04/29/2015 1028   PROTEINUR NEGATIVE 04/29/2015 1028   NITRITE NEGATIVE 04/29/2015 1028   LEUKOCYTESUR NEGATIVE 04/29/2015 1028    Radiological Exams on Admission: DG Chest 2 View  Result Date: 02/13/2021 CLINICAL DATA:  Shortness of breath, cough. EXAM: CHEST - 2 VIEW COMPARISON:  November 28, 2020. FINDINGS: The heart size and mediastinal contours are within normal limits. Hyperexpansion of the lungs is noted with emphysematous disease  noted bilaterally. Faint nodular density is noted in left lung apex. The visualized skeletal structures are unremarkable. IMPRESSION: Hyperexpansion and emphysematous change of the lungs is noted consistent with COPD. Faint nodular density is noted in left lung apex; CT scan of the chest is recommended to rule out pulmonary nodule or neoplasm. Emphysema (ICD10-J43.9). Electronically Signed   By: Lupita Raider M.D.   On: 02/13/2021 10:06    EKG: Independently reviewed. nsr  Assessment/Plan Principal Problem:   Influenza A Active Problems:   COPD (chronic obstructive pulmonary disease) (HCC)   Hypothyroid   Hypoxemia   HTN (hypertension)   Lung mass   # Acute hypoxic respiratory failure Baseline is normal, here 88% on room air. Given copd this isn't far from where she needs to be but is a sig change from her baseline. - [place] o2, wean as able - observation status as decent chance this will improve by tomorrow  # Influenza A No signs pneumonia. Will treat for copd exacerbation - start tamiflu - droplet precautions  # COPD With exacerbation - o2 and  flu as above - start prednisone - dulera for home advair, hold home spiriva - standing duonebs, prn albuterol - continue azithromycin, plan for 3 days of 500 mg  # HTN Here normotensive, ill - hole home amlodipine  # Hypothyroid - home levothyroxine  # Lung mass Recent ct with 4a lung-rads, has outpatient PET scheduled for this coming Wednesday  # Leukopenia Lab error vs acute illness. Wbc 2.8 - monitor  # Tobacco abuse Declines NRT  DVT prophylaxis: lovenox Code Status: partial (no intubation, otherwise yes)  Family Communication: none @ bedside  Consults called: none   Level of care: Med-Surg Status is: Observation  The patient remains OBS appropriate and will d/c before 2 midnights.       Silvano Bilis MD [facility] Pager [phone]  If 7PM-7AM, please contact  night-coverage [email or web address] Password [facility]  02/13/2021, 12:09 PM

## 2021-02-13 NOTE — ED Notes (Signed)
Pt to IP rm 130 with [employer]

## 2021-02-13 NOTE — ED Provider Notes (Signed)
[facility] Emergency Department Provider Note   ____________________________________________   Event Date/Time   First MD Initiated Contact with Patient 02/13/21 1002     (approximate)  I have reviewed the triage vital signs and the nursing notes.   HISTORY  Chief Complaint Shortness of Breath   HPI April Sweeney is a 55 y.o. female who is complaining of shortness of breath.  Is been going on since at least yesterday getting somewhat worse.  She has a cough productive of some green phlegm.  She has not been running a fever.  She complains of point tenderness in the lower left chest over one of the ribs.  She thinks she might have cracked a rib coughing and physical exam seems to bear this out.         Past Medical History:  Diagnosis Date   COPD (chronic obstructive pulmonary disease) (HCC)    Thyroid disease     Patient Active Problem List   Diagnosis Date Noted   COPD (chronic obstructive pulmonary disease) (HCC) 02/13/2021   Hypothyroid 02/13/2021   Influenza A 02/13/2021    Past Surgical History:  Procedure Laterality Date   BREAST CYST ASPIRATION Left 1992   CESAREAN SECTION     WRIST GANGLION EXCISION      Prior to Admission medications   Medication Sig Start Date End Date Taking? Authorizing Provider  albuterol (PROVENTIL HFA;VENTOLIN HFA) 108 (90 BASE) MCG/ACT inhaler Inhale into the lungs every 6 (six) hours as needed for wheezing or shortness of breath.    [provider]  azithromycin (ZITHROMAX Z-PAK) 250 MG tablet Take 2 tablets (500 mg) on  Day 1,  followed by 1 tablet (250 mg) once daily on Days 2 through 5. 11/28/20   Cuthriell, Delorise Royals, PA-C  benzonatate (TESSALON PERLES) 100 MG capsule Take 1 capsule (100 mg total) by mouth 3 (three) times daily as needed for cough. 11/28/20 11/28/21  Cuthriell, Delorise Royals, PA-C  Fluticasone-Salmeterol (ADVAIR) 100-50 MCG/DOSE AEPB Inhale 1 puff into the lungs 2 (two) times daily.     [provider]  levothyroxine (SYNTHROID, LEVOTHROID) 50 MCG tablet Take 75 mcg by mouth daily before breakfast.     [provider]  predniSONE (DELTASONE) 50 MG tablet Take 1 tablet (50 mg total) by mouth daily with breakfast. 11/28/20   Cuthriell, Delorise Royals, PA-C  Spacer/Aero-Holding Chambers (AEROCHAMBER PLUS) inhaler Use with inhaler 05/19/20   Domenick Gong, MD  tiotropium (SPIRIVA) 18 MCG inhalation capsule Place 18 mcg into inhaler and inhale daily.    [provider]    Allergies Percocet [oxycodone-acetaminophen]  Family History  Problem Relation Age of Onset   Dementia Mother    Cancer Father    Liver cancer Father    Breast cancer Neg Hx     Social History Social History   Tobacco Use   Smoking status: Every Day    Packs/day: 0.25    Years: 40.00    Pack years: 10.00    Types: Cigarettes   Smokeless tobacco: Never  Vaping Use   Vaping Use: Never used  Substance Use Topics   Alcohol use: No   Drug use: Never    Review of Systems  Constitutional: No fever/chills Eyes: No visual changes. ENT: No sore throat. Cardiovascular: No cardiovascular type pain but she does have rib pain. Respiratory: shortness of breath. Gastrointestinal: No abdominal pain.  No nausea, no vomiting.  No diarrhea.  No constipation. Genitourinary: Negative for dysuria.  Musculoskeletal: Negative for back pain. Skin: Negative for rash. Neurological: Negative for headaches, focal weakness   ____________________________________________   PHYSICAL EXAM:  VITAL SIGNS: ED Triage Vitals  Enc Vitals Group     BP 02/13/21 0923 120/72     Pulse Rate 02/13/21 0923 85     Resp 02/13/21 0923 19     Temp 02/13/21 0923 98.5 F (36.9 C)     Temp src --      SpO2 02/13/21 0923 (!) 88 %     Weight 02/13/21 0926 82 lb (37.2 kg)     Height 02/13/21 0926 5\' 5"  (1.651 m)     Head Circumference --      Peak Flow --      Pain Score 02/13/21 0926 8     Pain Loc  --      Pain Edu? --      Excl. in GC? --     Constitutional: Alert and oriented. Well appearing and in no acute distress. Eyes: Conjunctivae are normal.  Head: Atraumatic. Nose: No congestion/rhinnorhea. Mouth/Throat: Mucous membranes are moist.  Oropharynx non-erythematous. Neck: No stridor. \ Cardiovascular: Normal rate, regular rhythm. Grossly normal heart sounds.  Good peripheral circulation. Respiratory: Normal respiratory effort.  Slight retractions. Lungs CTAB. Gastrointestinal: Soft and nontender. No distention. No abdominal bruits.  Musculoskeletal: No lower extremity tenderness nor edema. Neurologic:  Normal speech and language. No gross focal neurologic deficits are appreciated.  Skin:  Skin is warm, dry and intact. No rash noted.  ____________________________________________   LABS (all labs ordered are listed, but only abnormal results are displayed)  Labs Reviewed  RESP PANEL BY RT-PCR (FLU A&B, COVID) ARPGX2 - Abnormal; Notable for the following components:      Result Value   Influenza A by PCR POSITIVE (*)    All other components within normal limits  BASIC METABOLIC PANEL - Abnormal; Notable for the following components:   Glucose, Bld 107 (*)    BUN <5 (*)    Calcium 8.8 (*)    All other components within normal limits  CBC WITH DIFFERENTIAL/PLATELET - Abnormal; Notable for the following components:   WBC 2.8 (*)    Neutro Abs 1.3 (*)    All other components within normal limits  PROCALCITONIN  TROPONIN I (HIGH SENSITIVITY)  TROPONIN I (HIGH SENSITIVITY)   ____________________________________________  EKG  EKG read interpreted by me shows normal sinus rhythm rate of 89 slight right axis new flipped T waves inferiorly from 2019.  Computer suspecting limb lead reversal. ____________________________________________  RADIOLOGY 2020, personally viewed and evaluated these images (plain radiographs) as part of my medical decision making, as  well as reviewing the written report by the radiologist.  ED MD interpretation: Chest x-ray reviewed by me shows hyperexpansion.  No obvious focal infiltrate.  There are a little bit more markings in the right base perhaps there is something going on there but again no focal infiltrate is obvious to me.  Official radiology report(s): DG Chest 2 View  Result Date: 02/13/2021 CLINICAL DATA:  Shortness of breath, cough. EXAM: CHEST - 2 VIEW COMPARISON:  November 28, 2020. FINDINGS: The heart size and mediastinal contours are within normal limits. Hyperexpansion of the lungs is noted with emphysematous disease noted bilaterally. Faint nodular density is noted in left lung apex. The visualized skeletal structures are unremarkable. IMPRESSION: Hyperexpansion and emphysematous change of the lungs is noted consistent with COPD. Faint nodular density is noted in left lung apex;  CT scan of the chest is recommended to rule out pulmonary nodule or neoplasm. Emphysema (ICD10-J43.9). Electronically Signed   By: Lupita Raider M.D.   On: 02/13/2021 10:06    ____________________________________________   PROCEDURES  Procedure(s) performed (including Critical Care): Critical care time 20 minutes.  This includes evaluating the patient and reviewing the old records and studies we did here today and then talking to the hospitalist.  Procedures   ____________________________________________   INITIAL IMPRESSION / ASSESSMENT AND PLAN / ED COURSE Patient has a nodule on chest x-ray.  Radiology recommends CT.  This was seen previously and apparently no CT was done.  Patient is scheduled to have a PET scan here on Wednesday.  Will not do the CT that I was planning to do because of that.  PET scan will be a better test anyway. ----------------------------------------- 11:01 AM on 02/13/2021 ----------------------------------------- After breathing treatment patient sats are slightly better but on moving in the  bed they drop again to [id number].  I have not even tried to get her up and walker.  We will have to get her in the hospital              ____________________________________________   FINAL CLINICAL IMPRESSION(S) / ED DIAGNOSES  Final diagnoses:  COPD exacerbation (HCC)  Lung nodule seen on imaging study     ED Discharge Orders     None        Note:  This document was prepared using Dragon voice recognition software and may include unintentional dictation errors.    Arnaldo Natal, MD 02/13/21 [id number]

## 2021-02-13 NOTE — Progress Notes (Signed)
Patient arrived to unit in stable condition via w/c and ambulated to bed.

## 2021-02-13 NOTE — Progress Notes (Signed)
PHARMACIST - PHYSICIAN COMMUNICATION  CONCERNING:  Enoxaparin (Lovenox) for DVT Prophylaxis    RECOMMENDATION: Patient was prescribed enoxaparin 40mg  q24 hours for VTE prophylaxis.   Filed Weights   02/13/21 0926  Weight: 37.2 kg (82 lb)    Body mass index is 13.65 kg/m.  Estimated Creatinine Clearance: 46.7 mL/min (by C-G formula based on SCr of 0.53 mg/dL).   Patient is candidate for enoxaparin 30mg  every 24 hours based on CrCl <110ml/min or Weight <45kg  DESCRIPTION: Pharmacy has adjusted enoxaparin dose per [facility] policy.  Patient is now receiving enoxaparin 30 mg every 24 hours    31m, PharmD Clinical Pharmacist  02/13/2021 12:30 PM

## 2021-02-13 NOTE — ED Triage Notes (Signed)
Patient c/o SOB and coughing beginning Wednesday. Patient hypoxic on RA, oxygen saturation 88%.

## 2021-02-14 LAB — CBC
HCT: 39.4 % (ref 36.0–46.0)
Hemoglobin: 12.9 g/dL (ref 12.0–15.0)
MCH: 31.5 pg (ref 26.0–34.0)
MCHC: 32.7 g/dL (ref 30.0–36.0)
MCV: 96.1 fL (ref 80.0–100.0)
Platelets: 157 10*3/uL (ref 150–400)
RBC: 4.1 MIL/uL (ref 3.87–5.11)
RDW: 11.8 % (ref 11.5–15.5)
WBC: 2.5 10*3/uL — ABNORMAL LOW (ref 4.0–10.5)
nRBC: 0 % (ref 0.0–0.2)

## 2021-02-14 LAB — BASIC METABOLIC PANEL
Anion gap: 6 (ref 5–15)
BUN: 7 mg/dL (ref 6–20)
CO2: 30 mmol/L (ref 22–32)
Calcium: 9.1 mg/dL (ref 8.9–10.3)
Chloride: 102 mmol/L (ref 98–111)
Creatinine, Ser: 0.35 mg/dL — ABNORMAL LOW (ref 0.44–1.00)
GFR, Estimated: >60 mL/min (ref >60)
Glucose, Bld: 109 mg/dL — ABNORMAL HIGH (ref 70–99)
Potassium: 3.8 mmol/L (ref 3.5–5.1)
Sodium: 138 mmol/L (ref 135–145)

## 2021-02-14 LAB — SAVE SMEAR(SSMR), FOR PROVIDER SLIDE REVIEW

## 2021-02-14 LAB — PATHOLOGIST SMEAR REVIEW

## 2021-02-14 MED ORDER — IPRATROPIUM-ALBUTEROL 0.5-2.5 (3) MG/3ML IN SOLN
3.0000 mL | Freq: Three times a day (TID) | RESPIRATORY_TRACT | Status: DC
  Administered 2021-02-15 (×2): 3 mL via RESPIRATORY_TRACT
  Filled 2021-02-14 (×2): qty 3

## 2021-02-14 NOTE — Progress Notes (Signed)
PROGRESS NOTE    April Sweeney  DSK:[id number] DOB: 23-Aug-1965 DOA: 02/13/2021 PCP: [facility]  Outpatient Specialists: none    Brief Narrative:   April Sweeney is a 55 y.o. female with medical history significant for copd, htn, hypothyroid, who presents with cough and fatigue.   Says has been feeling poorly with productive cough since covid in August. Symptoms never really improved but for past several days increased fatigue and body aches. Cough is somewhat productive. No fevers or chills. No n/v/d. No chest pain. Tolerating diet. No recent antibiotics. Has pet scan scheduled for Wednesday. Says has home o2 monitor and typical for her is 96%.     Assessment & Plan:   Principal Problem:   Influenza A Active Problems:   COPD (chronic obstructive pulmonary disease) (HCC)   Hypothyroid   Hypoxemia   HTN (hypertension)   Lung mass   # Acute hypoxic respiratory failure Baseline is normal, here 88% on room air. Given copd this isn't far from where she needs to be but is a sig change from her baseline. - [place] o2, wean as able - possible d/c tomorrow on o2   # Influenza A No signs pneumonia. Will treat for copd exacerbation - started tamiflu - droplet precautions - f/u sputum culture   # COPD With exacerbation - o2 and flu as above - started prednisone - dulera for home advair, hold home spiriva - standing duonebs, prn albuterol - continue azithromycin, plan for 3 days of 500 mg, today is day 2   # HTN Here normotensive, ill - hole home amlodipine   # Hypothyroid - home levothyroxine   # Lung mass Recent ct with 4a lung-rads, has outpatient PET scheduled for this coming Wednesday 12/14   # Leukopenia 2/2 illness - monitor - smear   # Tobacco abuse Declines NRT   DVT prophylaxis: lovenox Code Status: partial Family Communication: none @ bedside  Level of care: Med-Surg Status is: inpt  The patient will require care spanning > 2  midnights and should be moved to inpatient because: ongoing o2 requirement       Consultants:  none  Procedures: none  Antimicrobials:  azithromycin    Subjective: Subjectively feels somewhat improved. Rare cough, no sob. No chest pain, tolerating diet.  Objective: Vitals:   02/13/21 2350 02/14/21 0212 02/14/21 0522 02/14/21 0837  BP: 113/77  127/82 128/69  Pulse: 77  85 87  Resp: 14  16 16   Temp: 97.9 F (36.6 C)  (!) 97.5 F (36.4 C) 98.1 F (36.7 C)  TempSrc: Oral  Oral Oral  SpO2: 94% 93% 93% (!) 88%  Weight:      Height:       No intake or output data in the 24 hours ending 02/14/21 1054 Filed Weights   02/13/21 0926  Weight: 37.2 kg    Examination:  Constitutional: No acute distress, cachectic Head: Atraumatic Eyes: Conjunctiva clear ENM: Moist mucous membranes. Normal dentition.  Neck: Supple Respiratory: scattered rhonchi, faint exp wheeze Cardiovascular: Regular rate and rhythm. No murmurs/rubs/gallops. Abdomen: Non-tender, non-distended.  Musculoskeletal: No joint deformity upper and lower extremities. Normal ROM, no contractures. Normal muscle tone.  Skin: No rashes, lesions, or ulcers.  Extremities: No peripheral edema. Palpable peripheral pulses. Neurologic: Alert, moving all 4 extremities. Psychiatric: Normal insight and judgement.     Data Reviewed: I have personally reviewed following labs and imaging studies  CBC: Recent Labs  Lab 02/13/21 1004 02/14/21 0337  WBC 2.8*  2.5*  NEUTROABS 1.3*  --   HGB 14.1 12.9  HCT 42.4 39.4  MCV 97.9 96.1  PLT 154 157   Basic Metabolic Panel: Recent Labs  Lab 02/13/21 1004 02/14/21 0337  NA 135 138  K 3.8 3.8  CL 99 102  CO2 27 30  GLUCOSE 107* 109*  BUN <5* 7  CREATININE 0.53 0.35*  CALCIUM 8.8* 9.1   GFR: Estimated Creatinine Clearance: 46.7 mL/min (A) (by C-G formula based on SCr of 0.35 mg/dL (L)). Liver Function Tests: No results for input(s): AST, ALT, ALKPHOS, BILITOT,  PROT, ALBUMIN in the last 168 hours. No results for input(s): LIPASE, AMYLASE in the last 168 hours. No results for input(s): AMMONIA in the last 168 hours. Coagulation Profile: No results for input(s): INR, PROTIME in the last 168 hours. Cardiac Enzymes: No results for input(s): CKTOTAL, CKMB, CKMBINDEX, TROPONINI in the last 168 hours. BNP (last 3 results) No results for input(s): PROBNP in the last 8760 hours. HbA1C: No results for input(s): HGBA1C in the last 72 hours. CBG: No results for input(s): GLUCAP in the last 168 hours. Lipid Profile: No results for input(s): CHOL, HDL, LDLCALC, TRIG, CHOLHDL, LDLDIRECT in the last 72 hours. Thyroid Function Tests: No results for input(s): TSH, T4TOTAL, FREET4, T3FREE, THYROIDAB in the last 72 hours. Anemia Panel: No results for input(s): VITAMINB12, FOLATE, FERRITIN, TIBC, IRON, RETICCTPCT in the last 72 hours. Urine analysis:    Component Value Date/Time   COLORURINE YELLOW 04/29/2015 1028   APPEARANCEUR CLEAR 04/29/2015 1028   LABSPEC <1.005 (L) 04/29/2015 1028   PHURINE 6.0 04/29/2015 1028   GLUCOSEU NEGATIVE 04/29/2015 1028   HGBUR NEGATIVE 04/29/2015 1028   BILIRUBINUR NEGATIVE 04/29/2015 1028   KETONESUR NEGATIVE 04/29/2015 1028   PROTEINUR NEGATIVE 04/29/2015 1028   NITRITE NEGATIVE 04/29/2015 1028   LEUKOCYTESUR NEGATIVE 04/29/2015 1028   Sepsis Labs: @LABRCNTIP (procalcitonin:4,lacticidven:4)  ) Recent Results (from the past 240 hour(s))  Resp Panel by RT-PCR (Flu A&B, Covid) Nasopharyngeal Swab     Status: Abnormal   Collection Time: 02/13/21 10:04 AM   Specimen: Nasopharyngeal Swab; Nasopharyngeal(NP) swabs in vial transport medium  Result Value Ref Range Status   SARS Coronavirus 2 by RT PCR NEGATIVE NEGATIVE Final    Comment: (NOTE) SARS-CoV-2 target nucleic acids are NOT DETECTED.  The SARS-CoV-2 RNA is generally detectable in upper respiratory specimens during the acute phase of infection. The  lowest concentration of SARS-CoV-2 viral copies this assay can detect is 138 copies/mL. A negative result does not preclude SARS-Cov-2 infection and should not be used as the sole basis for treatment or other patient management decisions. A negative result may occur with  improper specimen collection/handling, submission of specimen other than nasopharyngeal swab, presence of viral mutation(s) within the areas targeted by this assay, and inadequate number of viral copies(<138 copies/mL). A negative result must be combined with clinical observations, patient history, and epidemiological information. The expected result is Negative.  Fact Sheet for Patients:  14/11/22  Fact Sheet for Healthcare Providers:  BloggerCourse.com  This test is no t yet approved or cleared by the [place] FDA and  has been authorized for detection and/or diagnosis of SARS-CoV-2 by FDA under an Emergency Use Authorization (EUA). This EUA will remain  in effect (meaning this test can be used) for the duration of the COVID-19 declaration under Section 564(b)(1) of the Act, 21 U.S.C.section 360bbb-3(b)(1), unless the authorization is terminated  or revoked sooner.       Influenza A by  PCR POSITIVE (A) NEGATIVE Final   Influenza B by PCR NEGATIVE NEGATIVE Final    Comment: (NOTE) The Xpert Xpress SARS-CoV-2/FLU/RSV plus assay is intended as an aid in the diagnosis of influenza from Nasopharyngeal swab specimens and should not be used as a sole basis for treatment. Nasal washings and aspirates are unacceptable for Xpert Xpress SARS-CoV-2/FLU/RSV testing.  Fact Sheet for Patients: BloggerCourse.com  Fact Sheet for Healthcare Providers: SeriousBroker.it  This test is not yet approved or cleared by the [place] FDA and has been authorized for detection and/or diagnosis of SARS-CoV-2 by FDA  under an Emergency Use Authorization (EUA). This EUA will remain in effect (meaning this test can be used) for the duration of the COVID-19 declaration under Section 564(b)(1) of the Act, 21 U.S.C. section 360bbb-3(b)(1), unless the authorization is terminated or revoked.  Performed at [facility], [address] [place], [place] [address]   Expectorated Sputum Assessment w Gram Stain, Rflx to Resp Cult     Status: None   Collection Time: 02/13/21 12:27 PM   Specimen: Sputum  Result Value Ref Range Status   Specimen Description SPUTUM  Final   Special Requests NONE  Final   Sputum evaluation   Final    THIS SPECIMEN IS ACCEPTABLE FOR SPUTUM CULTURE Performed at [facility], [address], [place], [place] [address]    Report Status 02/13/2021 FINAL  Final  Culture, Respiratory w Gram Stain     Status: None (Preliminary result)   Collection Time: 02/13/21 12:27 PM   Specimen: SPU  Result Value Ref Range Status   Specimen Description   Final    SPUTUM Performed at [facility], [address], [place], [place] [address]    Special Requests   Final    NONE Reflexed from [id number] Performed at [facility], [address] [place], [place] [address]    Gram Stain   Final    RARE WBC PRESENT, PREDOMINANTLY MONONUCLEAR FEW YEAST Performed at [facility], [address] [place], [place] [address]    Culture PENDING  Incomplete   Report Status PENDING  Incomplete         Radiology Studies: DG Chest 2 View  Result Date: 02/13/2021 CLINICAL DATA:  Shortness of breath, cough. EXAM: CHEST - 2 VIEW COMPARISON:  November 28, 2020. FINDINGS: The heart size and mediastinal contours are within normal limits. Hyperexpansion of the lungs is noted with emphysematous disease noted bilaterally. Faint nodular density is noted in left lung apex. The visualized skeletal structures are unremarkable. IMPRESSION: Hyperexpansion and emphysematous  change of the lungs is noted consistent with COPD. Faint nodular density is noted in left lung apex; CT scan of the chest is recommended to rule out pulmonary nodule or neoplasm. Emphysema (ICD10-J43.9). Electronically Signed   By: Lupita Raider M.D.   On: 02/13/2021 10:06        Scheduled Meds:  azithromycin  500 mg Oral Daily   enoxaparin (LOVENOX) injection  30 mg Subcutaneous Q24H   fluticasone furoate-vilanterol  1 puff Inhalation Daily   ipratropium-albuterol  3 mL Nebulization Q6H   levothyroxine  75 mcg Oral QAC breakfast   oseltamivir  75 mg Oral BID   predniSONE  40 mg Oral Q breakfast   sodium chloride flush  3 mL Intravenous Q12H   Continuous Infusions:  sodium chloride       LOS: 0 days    Time spent: 30 min  Silvano Bilis, MD [facility]   If 7PM-7AM, please contact night-coverage [email or web address] Password [facility] 02/14/2021, 10:54 AM

## 2021-02-14 NOTE — Progress Notes (Signed)
SATURATION QUALIFICATIONS: (This note is used to comply with regulatory documentation for home oxygen)  Patient Saturations on Room Air at Rest = 90%  Patient Saturations on Room Air while Ambulating = 85%  Patient Saturations on 2 Liters of oxygen while Ambulating = 92%  Please briefly explain why patient needs home oxygen: Hx of COPD.

## 2021-02-15 LAB — BASIC METABOLIC PANEL
Anion gap: 6 (ref 5–15)
BUN: 8 mg/dL (ref 6–20)
CO2: 33 mmol/L — ABNORMAL HIGH (ref 22–32)
Calcium: 9.4 mg/dL (ref 8.9–10.3)
Chloride: 102 mmol/L (ref 98–111)
Creatinine, Ser: 0.43 mg/dL — ABNORMAL LOW (ref 0.44–1.00)
GFR, Estimated: >60 mL/min (ref >60)
Glucose, Bld: 90 mg/dL (ref 70–99)
Potassium: 4.3 mmol/L (ref 3.5–5.1)
Sodium: 141 mmol/L (ref 135–145)

## 2021-02-15 LAB — CBC WITH DIFFERENTIAL/PLATELET
Abs Immature Granulocytes: 0.02 10*3/uL (ref 0.00–0.07)
Basophils Absolute: 0 10*3/uL (ref 0.0–0.1)
Basophils Relative: 0 %
Eosinophils Absolute: 0 10*3/uL (ref 0.0–0.5)
Eosinophils Relative: 0 %
HCT: 40.7 % (ref 36.0–46.0)
Hemoglobin: 13.6 g/dL (ref 12.0–15.0)
Immature Granulocytes: 0 %
Lymphocytes Relative: 58 %
Lymphs Abs: 2.5 10*3/uL (ref 0.7–4.0)
MCH: 32.5 pg (ref 26.0–34.0)
MCHC: 33.4 g/dL (ref 30.0–36.0)
MCV: 97.1 fL (ref 80.0–100.0)
Monocytes Absolute: 0.5 10*3/uL (ref 0.1–1.0)
Monocytes Relative: 11 %
Neutro Abs: 1.4 10*3/uL — ABNORMAL LOW (ref 1.7–7.7)
Neutrophils Relative %: 31 %
Platelets: 149 10*3/uL — ABNORMAL LOW (ref 150–400)
RBC: 4.19 MIL/uL (ref 3.87–5.11)
RDW: 11.9 % (ref 11.5–15.5)
WBC Morphology: ABNORMAL
WBC: 4.5 10*3/uL (ref 4.0–10.5)
nRBC: 0 % (ref 0.0–0.2)

## 2021-02-15 LAB — HIV ANTIBODY (ROUTINE TESTING W REFLEX): HIV Screen 4th Generation wRfx: NONREACTIVE

## 2021-02-15 MED ORDER — PREDNISONE 20 MG PO TABS
40.0000 mg | ORAL_TABLET | Freq: Every day | ORAL | 0 refills | Status: DC
Start: 2021-02-16 — End: 2021-04-21

## 2021-02-15 MED ORDER — OSELTAMIVIR PHOSPHATE 75 MG PO CAPS: 75.0000 mg | ORAL_CAPSULE | Freq: Two times a day (BID) | ORAL | 0 refills | Status: DC

## 2021-02-15 NOTE — Progress Notes (Signed)
PT AVS reviewed and pt verbalized understanding of all teaching. PT has all pt belongings in pt possession and will be going home with 02 and daughter as transportation.

## 2021-02-15 NOTE — TOC Progression Note (Signed)
Transition of Care [facility]) - Progression Note    Patient Details  Name: April Sweeney MRN: [id number] Date of Birth: 1966/01/29  Transition of Care [facility]) CM/SW Contact  Caryn Section, RN Phone Number: 02/15/2021, 1:24 PM  Clinical Narrative:   Patient being discharged today with home oxygen.  States she lives at home with her family, who can assist her and transport her to appointments and pharmacy.  Patient denies any concerns with taking medications as prescribed.    Patient will be discharged on home oxygen.  [facility], Bjorn Loser notified  oxygen to be delivered to room.  Patient has [employer] as pcp, confirmed.  Denies any further concerns about returning home at discharge.  TOC contact information provided.    Expected Discharge Plan: Home/Self Care Barriers to Discharge: Barriers Resolved  Expected Discharge Plan and Services Expected Discharge Plan: Home/Self Care   Discharge Planning Services: CM Consult   Living arrangements for the past 2 months: Single Family Home Expected Discharge Date: 02/15/21                 DME Agency: [employer] Date DME Agency Contacted: 02/15/21 Time DME Agency Contacted: 1324 Representative spoke with at DME Agency: rhonda             Social Determinants of Health (SDOH) Interventions    Readmission Risk Interventions No flowsheet data found.

## 2021-02-15 NOTE — Discharge Summary (Signed)
April Sweeney KAJ:[id number] DOB: Jan 11, 1966 DOA: 02/13/2021  PCP: [facility]  Admit date: 02/13/2021 Discharge date: 02/15/2021  Time spent: 45 minutes  Recommendations for Outpatient Follow-up:  Follow-up with the illustrious Dr. Chipper Herb in one week     Discharge Diagnoses:  Principal Problem:   Influenza A Active Problems:   COPD (chronic obstructive pulmonary disease) (HCC)   Hypothyroid   Hypoxemia   HTN (hypertension)   Lung mass   Discharge Condition: stable  Diet recommendation: regular  Filed Weights   02/13/21 0926  Weight: 37.2 kg    History of present illness:  April Sweeney is a 55 y.o. female with medical history significant for copd, htn, hypothyroid, who presents with cough and fatigue.   Says has been feeling poorly with productive cough since covid in August. Symptoms never really improved but for past several days increased fatigue and body aches. Cough is somewhat productive. No fevers or chills. No n/v/d. No chest pain. Tolerating diet. No recent antibiotics. Has pet scan scheduled for Wednesday. Says has home o2 monitor and typical for her is 96%.    Hospital Course:  Presented with fatigue and cough. Found to have o2 in the 80s, normalized with 1-2 liters  O2. CXR w/o focal infiltrate or effusion. Covid negative but flu a positive. Wheezing as well. Treated for flu with tamiflu and copd exacerbation with steroids and azithromycin. Will d/c on tamiflu and steroids. Persistent o2 requirement of 1-2 liters on background of copd and possible new lung cancer, discharging on home o2. Has pet scan scheduled tomorrow to f/u abnormal ct scan of lung. Advised pcp f/u in 1 week to f/u results of pet scan and to f/u this hospitalization. May be able to d/c o2 then. Of note patient had leukopenia that resolved on day of discharge. Peripheral smear was unremarkable.   Procedures: none   Consultations: none  Discharge Exam: Vitals:    02/15/21 0508 02/15/21 0802  BP: 119/78 138/85  Pulse: 85 86  Resp: 18 20  Temp: 98.6 F (37 C) 97.9 F (36.6 C)  SpO2: 97% 100%    Constitutional: No acute distress, cachectic Head: Atraumatic Eyes: Conjunctiva clear ENM: Moist mucous membranes. Normal dentition.  Neck: Supple Respiratory: scattered rhonchi, faint exp wheeze Cardiovascular: Regular rate and rhythm. No murmurs/rubs/gallops. Abdomen: Non-tender, non-distended.  Musculoskeletal: No joint deformity upper and lower extremities. Normal ROM, no contractures. Normal muscle tone.  Skin: No rashes, lesions, or ulcers.  Extremities: No peripheral edema. Palpable peripheral pulses. Neurologic: Alert, moving all 4 extremities. Psychiatric: Normal insight and judgement.   Discharge Instructions   Discharge Instructions     Diet - low sodium heart healthy   Complete by: As directed    Increase activity slowly   Complete by: As directed       Allergies as of 02/15/2021       Reactions   Percocet [oxycodone-acetaminophen] Hives        Medication List     TAKE these medications    AeroChamber Plus inhaler Use with inhaler   albuterol 108 (90 Base) MCG/ACT inhaler Commonly known as: VENTOLIN HFA Inhale 2 puffs into the lungs every 6 (six) hours as needed for wheezing or shortness of breath.   amLODipine 5 MG tablet Commonly known as: NORVASC Take 5 mg by mouth every morning.   benzonatate 100 MG capsule Commonly known as: Tessalon Perles Take 1 capsule (100 mg total) by mouth 3 (three) times daily as needed for  cough.   ELDERBERRY PO Take 1 tablet by mouth every morning.   fluticasone-salmeterol 250-50 MCG/ACT Aepb Commonly known as: ADVAIR Inhale 1 puff into the lungs 2 (two) times daily.   GOODY HEADACHE PO Take 1 packet by mouth 3 (three) times daily.   ibuprofen 200 MG tablet Commonly known as: ADVIL Take 400 mg by mouth 2 (two) times daily as needed for headache (pain).   levothyroxine  88 MCG tablet Commonly known as: SYNTHROID Take 88 mcg by mouth daily before breakfast.   MAGNESIUM PO Take 1 tablet by mouth at bedtime.   oseltamivir 75 MG capsule Commonly known as: TAMIFLU Take 1 capsule (75 mg total) by mouth 2 (two) times daily.   predniSONE 20 MG tablet Commonly known as: DELTASONE Take 2 tablets (40 mg total) by mouth daily with breakfast. Start taking on: February 16, 2021   tiotropium 18 MCG inhalation capsule Commonly known as: SPIRIVA Place 18 mcg into inhaler and inhale every morning.   VITAMIN B-12 PO Take 1 tablet by mouth every morning.   VITAMIN D3 PO Take 1 tablet by mouth every morning.   ZINC PO Take 1 tablet by mouth at bedtime.               Durable Medical Equipment  (From admission, onward)           Start     Ordered   02/15/21 1123  DME Oxygen  Once       Question Answer Comment  Length of Need 6 Months   Liters per Minute 2   Frequency Continuous (stationary and portable oxygen unit needed)   Oxygen delivery system Gas      02/15/21 1122           Allergies  Allergen Reactions   Percocet [Oxycodone-Acetaminophen] Hives      The results of significant diagnostics from this hospitalization (including imaging, microbiology, ancillary and laboratory) are listed below for reference.    Significant Diagnostic Studies: DG Chest 2 View  Result Date: 02/13/2021 CLINICAL DATA:  Shortness of breath, cough. EXAM: CHEST - 2 VIEW COMPARISON:  November 28, 2020. FINDINGS: The heart size and mediastinal contours are within normal limits. Hyperexpansion of the lungs is noted with emphysematous disease noted bilaterally. Faint nodular density is noted in left lung apex. The visualized skeletal structures are unremarkable. IMPRESSION: Hyperexpansion and emphysematous change of the lungs is noted consistent with COPD. Faint nodular density is noted in left lung apex; CT scan of the chest is recommended to rule out  pulmonary nodule or neoplasm. Emphysema (ICD10-J43.9). Electronically Signed   By: Lupita Raider M.D.   On: 02/13/2021 10:06    Microbiology: Recent Results (from the past 240 hour(s))  Resp Panel by RT-PCR (Flu A&B, Covid) Nasopharyngeal Swab     Status: Abnormal   Collection Time: 02/13/21 10:04 AM   Specimen: Nasopharyngeal Swab; Nasopharyngeal(NP) swabs in vial transport medium  Result Value Ref Range Status   SARS Coronavirus 2 by RT PCR NEGATIVE NEGATIVE Final    Comment: (NOTE) SARS-CoV-2 target nucleic acids are NOT DETECTED.  The SARS-CoV-2 RNA is generally detectable in upper respiratory specimens during the acute phase of infection. The lowest concentration of SARS-CoV-2 viral copies this assay can detect is 138 copies/mL. A negative result does not preclude SARS-Cov-2 infection and should not be used as the sole basis for treatment or other patient management decisions. A negative result may occur with  improper specimen collection/handling, submission of  specimen other than nasopharyngeal swab, presence of viral mutation(s) within the areas targeted by this assay, and inadequate number of viral copies(<138 copies/mL). A negative result must be combined with clinical observations, patient history, and epidemiological information. The expected result is Negative.  Fact Sheet for Patients:  BloggerCourse.com  Fact Sheet for Healthcare Providers:  SeriousBroker.it  This test is no t yet approved or cleared by the [place] FDA and  has been authorized for detection and/or diagnosis of SARS-CoV-2 by FDA under an Emergency Use Authorization (EUA). This EUA will remain  in effect (meaning this test can be used) for the duration of the COVID-19 declaration under Section 564(b)(1) of the Act, 21 U.S.C.section 360bbb-3(b)(1), unless the authorization is terminated  or revoked sooner.       Influenza A by PCR POSITIVE  (A) NEGATIVE Final   Influenza B by PCR NEGATIVE NEGATIVE Final    Comment: (NOTE) The Xpert Xpress SARS-CoV-2/FLU/RSV plus assay is intended as an aid in the diagnosis of influenza from Nasopharyngeal swab specimens and should not be used as a sole basis for treatment. Nasal washings and aspirates are unacceptable for Xpert Xpress SARS-CoV-2/FLU/RSV testing.  Fact Sheet for Patients: BloggerCourse.com  Fact Sheet for Healthcare Providers: SeriousBroker.it  This test is not yet approved or cleared by the [place] FDA and has been authorized for detection and/or diagnosis of SARS-CoV-2 by FDA under an Emergency Use Authorization (EUA). This EUA will remain in effect (meaning this test can be used) for the duration of the COVID-19 declaration under Section 564(b)(1) of the Act, 21 U.S.C. section 360bbb-3(b)(1), unless the authorization is terminated or revoked.  Performed at [facility], [address] [place], [place] [address]   Expectorated Sputum Assessment w Gram Stain, Rflx to Resp Cult     Status: None   Collection Time: 02/13/21 12:27 PM   Specimen: Sputum  Result Value Ref Range Status   Specimen Description SPUTUM  Final   Special Requests NONE  Final   Sputum evaluation   Final    THIS SPECIMEN IS ACCEPTABLE FOR SPUTUM CULTURE Performed at [facility], [address], [place], [place] [address]    Report Status 02/13/2021 FINAL  Final  Culture, Respiratory w Gram Stain     Status: None (Preliminary result)   Collection Time: 02/13/21 12:27 PM   Specimen: SPU  Result Value Ref Range Status   Specimen Description   Final    SPUTUM Performed at [facility], [address] [place], [place] [address]    Special Requests   Final    NONE Reflexed from [id number] Performed at [facility], [address] [place], [place] [address]    Gram Stain   Final    RARE WBC  PRESENT, PREDOMINANTLY MONONUCLEAR FEW YEAST    Culture   Final    RARE PSEUDOMONAS AERUGINOSA SUSCEPTIBILITIES TO FOLLOW Performed at [facility], [address] [place], [place] [address]    Report Status PENDING  Incomplete     Labs: Basic Metabolic Panel: Recent Labs  Lab 02/13/21 1004 02/14/21 0337 02/15/21 0653  NA 135 138 141  K 3.8 3.8 4.3  CL 99 102 102  CO2 27 30 33*  GLUCOSE 107* 109* 90  BUN <5* 7 8  CREATININE 0.53 0.35* 0.43*  CALCIUM 8.8* 9.1 9.4   Liver Function Tests: No results for input(s): AST, ALT, ALKPHOS, BILITOT, PROT, ALBUMIN in the last 168 hours. No results for input(s):  LIPASE, AMYLASE in the last 168 hours. No results for input(s): AMMONIA in the last 168 hours. CBC: Recent Labs  Lab 02/13/21 1004 02/14/21 0337 02/15/21 0653  WBC 2.8* 2.5* 4.5  NEUTROABS 1.3*  --  1.4*  HGB 14.1 12.9 13.6  HCT 42.4 39.4 40.7  MCV 97.9 96.1 97.1  PLT 154 157 149*   Cardiac Enzymes: No results for input(s): CKTOTAL, CKMB, CKMBINDEX, TROPONINI in the last 168 hours. BNP: BNP (last 3 results) No results for input(s): BNP in the last 8760 hours.  ProBNP (last 3 results) No results for input(s): PROBNP in the last 8760 hours.  CBG: No results for input(s): GLUCAP in the last 168 hours.     Signed:  Silvano Bilis MD.  [facility] 02/15/2021, 11:50 AM

## 2021-02-16 ENCOUNTER — Other Ambulatory Visit: Payer: Self-pay

## 2021-02-16 ENCOUNTER — Ambulatory Visit
Admission: RE | Admit: 2021-02-16 | Discharge: 2021-02-16 | Disposition: A | Discharge status: Home / Self Care | Payer: Self-pay | Source: Ambulatory Visit | Attending: Student | Admitting: Student

## 2021-02-16 DIAGNOSIS — I2510 Atherosclerotic heart disease of native coronary artery without angina pectoris: Secondary | ICD-10-CM | POA: Insufficient documentation

## 2021-02-16 DIAGNOSIS — I700 Atherosclerosis of aorta: Secondary | ICD-10-CM | POA: Insufficient documentation

## 2021-02-16 DIAGNOSIS — R911 Solitary pulmonary nodule: Secondary | ICD-10-CM | POA: Insufficient documentation

## 2021-02-16 DIAGNOSIS — Z681 Body mass index (BMI) 19 or less, adult: Secondary | ICD-10-CM | POA: Insufficient documentation

## 2021-02-16 DIAGNOSIS — R634 Abnormal weight loss: Secondary | ICD-10-CM | POA: Insufficient documentation

## 2021-02-16 LAB — GLUCOSE, CAPILLARY: Glucose-Capillary: 87 mg/dL (ref 70–99)

## 2021-02-16 LAB — CULTURE, RESPIRATORY W GRAM STAIN

## 2021-02-16 MED ORDER — FLUDEOXYGLUCOSE F - 18 (FDG) INJECTION
5.2000 | Freq: Once | INTRAVENOUS | Status: AC | PRN
  Administered 2021-02-16: 09:00:00 5.42 via INTRAVENOUS

## 2021-04-21 ENCOUNTER — Ambulatory Visit
Admission: EM | Admit: 2021-04-21 | Discharge: 2021-04-21 | Disposition: A | Discharge status: Home / Self Care | Payer: Self-pay | Attending: Family | Admitting: Family

## 2021-04-21 ENCOUNTER — Other Ambulatory Visit: Payer: Self-pay

## 2021-04-21 DIAGNOSIS — R0989 Other specified symptoms and signs involving the circulatory and respiratory systems: Secondary | ICD-10-CM

## 2021-04-21 DIAGNOSIS — R053 Chronic cough: Secondary | ICD-10-CM

## 2021-04-21 DIAGNOSIS — J441 Chronic obstructive pulmonary disease with (acute) exacerbation: Secondary | ICD-10-CM

## 2021-04-21 MED ORDER — PREDNISONE 20 MG PO TABS: 40.0000 mg | ORAL_TABLET | Freq: Every day | ORAL | 0 refills | Status: AC

## 2021-04-21 MED ORDER — AMOXICILLIN-POT CLAVULANATE 875-125 MG PO TABS: 1.0000 | ORAL_TABLET | Freq: Two times a day (BID) | ORAL | 0 refills | Status: AC

## 2021-04-21 MED ORDER — BENZONATATE 100 MG PO CAPS: 100.0000 mg | ORAL_CAPSULE | Freq: Three times a day (TID) | ORAL | 0 refills | Status: DC | PRN

## 2021-04-21 NOTE — Discharge Instructions (Addendum)
Recommend start Augmentin 875mg  twice a day as directed. May take Prednisone 40mg  daily for 5 days to help with inflammation and cough. May use Tessalon cough pills 100mg  every 8 hours as needed. Continue to increase fluids to help loosen up mucus in chest. Continue Spiriva once daily and Advair twice daily as directed. Continue Albuterol inhaler 2 puffs every 6 hours as needed. Follow-up here in 4 to 5 days if not improving or sooner if worsening. If you experience more shortness of breath, fever or worsening of cough, go to the ER ASAP.

## 2021-04-21 NOTE — ED Triage Notes (Signed)
Patient is here for "Cough". History of COPD. Last month got a cold "and cough is persistent, not getting better". Some SOB. No fever.

## 2021-04-21 NOTE — ED Provider Notes (Signed)
[name]    CSN: [id number] Arrival date & time: 04/21/21  1145      History   Chief Complaint No chief complaint on file.   HPI April Sweeney is a 56 y.o. female.   56 year old female presents with cough and chest congestion that has been getting worse over the past week. Started with some nasal congestion and cough about 1 month ago, was improving some but now coughing up more mucus. Denies any fever or diarrhea. Has had some shortness of breath. Took a home COVID test in the past few days which was negative. Has been taking Dayquil and Nyquil with minimal relief. Has history of COPD with frequent exacerbations at least 3 to 4 times a year. Last one Dec 2022 in which she was briefly hospitalized at [facility]. She had a PET scan performed and no new lung masses or issues detected. She currently uses Spiriva once daily and Advair twice a day along with Albuterol inhaler every 4 to 6 hours as needed. Other chronic health issues include HTN, thyroid disorder, tobacco use disorder and low body mass.  Currently also on Norvasc and Synthroid. Does not currently have a PCP and does not have any health insurance.   The history is provided by the patient.   Past Medical History:  Diagnosis Date   COPD (chronic obstructive pulmonary disease) ([place])    Thyroid disease     Patient Active Problem List   Diagnosis Date Noted   COPD (chronic obstructive pulmonary disease) ([place]) 02/13/2021   Hypothyroid 02/13/2021   Influenza A 02/13/2021   Hypoxemia 02/13/2021   HTN (hypertension) 02/13/2021   Lung mass 02/13/2021    Past Surgical History:  Procedure Laterality Date   BREAST CYST ASPIRATION Left 1992   CESAREAN SECTION     WRIST GANGLION EXCISION      OB History     Gravida  2   Para  2   Term      Preterm      AB      Living         SAB      IAB      Ectopic      Multiple      Live Births               Home Medications    Prior to Admission  medications   Medication Sig Start Date End Date Taking? Authorizing Provider  albuterol (PROVENTIL HFA;VENTOLIN HFA) 108 (90 BASE) MCG/ACT inhaler Inhale 2 puffs into the lungs every 6 (six) hours as needed for wheezing or shortness of breath. Last used: 800am.   Yes [provider]  amLODipine (NORVASC) 5 MG tablet Take 5 mg by mouth every morning.   Yes [provider]  amoxicillin-clavulanate (AUGMENTIN) 875-125 MG tablet Take 1 tablet by mouth every 12 (twelve) hours for 7 days. 04/21/21 04/28/21 Yes Austynn Pridmore, Nicholes Stairs, NP  Cholecalciferol (VITAMIN D3 PO) Take 1 tablet by mouth every morning.   Yes [provider]  Cyanocobalamin (VITAMIN B-12 PO) Take 1 tablet by mouth every morning.   Yes [provider]  ELDERBERRY PO Take 1 tablet by mouth every morning.   Yes [provider]  fluticasone-salmeterol (ADVAIR) 250-50 MCG/ACT AEPB Inhale 1 puff into the lungs 2 (two) times daily.   Yes [provider]  ibuprofen (ADVIL) 200 MG tablet Take 400 mg by mouth 2 (two) times daily as needed for headache (pain).  Yes [provider]  levothyroxine (SYNTHROID) 88 MCG tablet Take 88 mcg by mouth daily before breakfast.   Yes [provider]  MAGNESIUM PO Take 1 tablet by mouth at bedtime.   Yes [provider]  Multiple Vitamins-Minerals (ZINC PO) Take 1 tablet by mouth at bedtime.   Yes [provider]  Spacer/Aero-Holding Chambers (AEROCHAMBER PLUS) inhaler Use with inhaler 05/19/20  Yes Melynda Ripple, MD  tiotropium (SPIRIVA) 18 MCG inhalation capsule Place 18 mcg into inhaler and inhale every morning.   Yes [provider]  Aspirin-Acetaminophen-Caffeine (GOODY HEADACHE PO) Take 1 packet by mouth 3 (three) times daily.    [provider]  benzonatate (TESSALON PERLES) 100 MG capsule Take 1 capsule (100 mg total) by mouth 3 (three) times daily as needed for cough. 04/21/21 04/21/22  Katy Apo, NP  predniSONE (DELTASONE) 20 MG tablet Take 2 tablets (40 mg total) by mouth daily for 5 days. 04/21/21 04/26/21  Katy Apo, NP    Family History Family History  Problem Relation Age of Onset   Dementia Mother    Cancer Father    Liver cancer Father    Breast cancer Neg Hx     Social History Social History   Tobacco Use   Smoking status: Every Day    Packs/day: 0.25    Years: 40.00    Pack years: 10.00    Types: Cigarettes   Smokeless tobacco: Never  Vaping Use   Vaping Use: Never used  Substance Use Topics   Alcohol use: No   Drug use: Never     Allergies   Percocet [oxycodone-acetaminophen]   Review of Systems Review of Systems  Constitutional:  Positive for fatigue. Negative for activity change, appetite change, chills, diaphoresis and fever.  HENT:  Positive for congestion (mostly chest) and postnasal drip. Negative for ear discharge, ear pain, mouth sores, nosebleeds, rhinorrhea, sinus pressure, sinus pain, sore throat and trouble swallowing.   Eyes:  Negative for discharge, redness and itching.  Respiratory:  Positive for cough, chest tightness and shortness of breath.   Cardiovascular:  Negative for chest pain.  Gastrointestinal:  Negative for diarrhea and vomiting.  Musculoskeletal:  Negative for arthralgias, myalgias, neck pain and neck stiffness.  Skin:  Negative for color change and rash.  Allergic/Immunologic: Negative for environmental allergies and food allergies.  Neurological:  Negative for dizziness, tremors, seizures, syncope, speech difficulty, numbness and headaches.  Hematological:  Negative for adenopathy. Does not bruise/bleed easily.    Physical Exam Triage Vital Signs ED Triage Vitals [04/21/21 1150]  Enc Vitals Group     BP (!) 145/80     Pulse Rate 94     Resp (!) 24     Temp 98.3 F (36.8 C)     Temp Source Oral     SpO2 97 %     Weight 85 lb (38.6 kg)     Height 5\' 5"  (1.651 m)     Head Circumference      Peak  Flow      Pain Score 0     Pain Loc      Pain Edu?      Excl. in [place]?    No data found.  Updated Vital Signs BP (!) 145/80 (BP Location: Left Arm)    Pulse 91    Temp 98.3 F (36.8 C) (Oral)    Resp (!) 24    Ht 5\' 5"  (1.651 m)    Wt 85  lb (38.6 kg)    LMP 02/17/2014    SpO2 95%    BMI 14.14 kg/m   Visual Acuity Right Eye Distance:   Left Eye Distance:   Bilateral Distance:    Right Eye Near:   Left Eye Near:    Bilateral Near:     Physical Exam Vitals and nursing note reviewed.  Constitutional:      General: She is awake. She is not in acute distress.    Appearance: She is well-groomed. She is cachectic.     Comments: She is sitting on the exam table in no acute distress, talking in complete sentences but does have slight increase work of breathing.   HENT:     Head: Normocephalic and atraumatic.     Right Ear: Hearing, tympanic membrane, ear canal and external ear normal.     Left Ear: Hearing, tympanic membrane, ear canal and external ear normal.     Nose: Nose normal.     Right Sinus: No maxillary sinus tenderness or frontal sinus tenderness.     Left Sinus: No maxillary sinus tenderness or frontal sinus tenderness.     Mouth/Throat:     Lips: Pink.     Mouth: Mucous membranes are moist.     Dentition: Has dentures.     Pharynx: Uvula midline. Oropharyngeal exudate and posterior oropharyngeal erythema present. No pharyngeal swelling or uvula swelling.     Comments: Slight yellowish post nasal drainage present Eyes:     Extraocular Movements: Extraocular movements intact.     Conjunctiva/sclera: Conjunctivae normal.  Cardiovascular:     Rate and Rhythm: Normal rate and regular rhythm.     Heart sounds: Normal heart sounds. No murmur heard. Pulmonary:     Effort: Tachypnea and accessory muscle usage present. No respiratory distress or retractions.     Breath sounds: Normal air entry. No decreased air movement. Examination of the right-upper field reveals wheezing and  rhonchi. Examination of the left-upper field reveals wheezing and rhonchi. Examination of the right-middle field reveals wheezing. Examination of the right-lower field reveals decreased breath sounds and wheezing. Examination of the left-lower field reveals decreased breath sounds and wheezing. Decreased breath sounds, wheezing and rhonchi present.     Comments: Coarse breath sounds in upper lobes, especially with coughing.  Musculoskeletal:        General: Normal range of motion.     Cervical back: Normal range of motion and neck supple.  Lymphadenopathy:     Cervical: No cervical adenopathy.  Skin:    General: Skin is warm and dry.     Capillary Refill: Capillary refill takes less than 2 seconds.     Findings: No rash.  Neurological:     General: No focal deficit present.     Mental Status: She is alert and oriented to person, place, and time.  Psychiatric:        Mood and Affect: Mood normal.        Behavior: Behavior normal. Behavior is cooperative.        Thought Content: Thought content normal.        Judgment: Judgment normal.     UC Treatments / Results  Labs (all labs ordered are listed, but only abnormal results are displayed) Labs Reviewed - No data to display  EKG   Radiology No results found.  Procedures Procedures (including critical care time)  Medications Ordered in UC Medications - No data to display  Initial Impression / Assessment and Plan / UC  Course  I have reviewed the triage vital signs and the nursing notes.  Pertinent labs & imaging results that were available during my care of the patient were reviewed by me and considered in my medical decision making (see chart for details).     Reviewed with patient that she appears to have a upper respiratory illness that is contributing to an exacerbation of her COPD. Patient indicates Z-pak is not helpful and Amoxicillin/Augmentin has been helpful in the past. Patient is stable and in no acute distress and  I feel she can be managed in the outpatient setting at this time. Will start Augmentin 875mg  twice a day as directed. Recommend Prednisone 40mg  daily for 5 days then stop to help with inflammation and cough. May use Tessalon cough pills 100mg  every 8 hours as needed. Continue to increase fluids to help loosen up mucus in chest. Continue Spiriva and Advair as directed. Continue Albuterol inhaler 2 puffs every 6 hours as needed. Discussed need to see a PCP for continued management of her chronic health issues as well as for nutritional assistance. Follow-up here in 4 to 5 days if not improving or sooner if worsening. If experiencing more shortness of breath, fever or worsening of cough, go to the ER ASAP.  Final Clinical Impressions(s) / UC Diagnoses   Final diagnoses:  COPD exacerbation (HCC)  Chronic cough  Chest congestion     Discharge Instructions      Recommend start Augmentin 875mg  twice a day as directed. May take Prednisone 40mg  daily for 5 days to help with inflammation and cough. May use Tessalon cough pills 100mg  every 8 hours as needed. Continue to increase fluids to help loosen up mucus in chest. Continue Spiriva once daily and Advair twice daily as directed. Continue Albuterol inhaler 2 puffs every 6 hours as needed. Follow-up here in 4 to 5 days if not improving or sooner if worsening. If you experience more shortness of breath, fever or worsening of cough, go to the ER ASAP.     ED Prescriptions     Medication Sig Dispense Auth. Provider   benzonatate (TESSALON PERLES) 100 MG capsule Take 1 capsule (100 mg total) by mouth 3 (three) times daily as needed for cough. 21 capsule Katy Apo, NP   predniSONE (DELTASONE) 20 MG tablet Take 2 tablets (40 mg total) by mouth daily for 5 days. 10 tablet Katy Apo, NP   amoxicillin-clavulanate (AUGMENTIN) 875-125 MG tablet Take 1 tablet by mouth every 12 (twelve) hours for 7 days. 14 tablet Makaylia Hewett, Nicholes Stairs, NP      PDMP not  reviewed this encounter.   Katy Apo, NP 04/22/21 [phone]

## 2021-05-31 ENCOUNTER — Ambulatory Visit (INDEPENDENT_AMBULATORY_CARE_PROVIDER_SITE_OTHER): Payer: Self-pay

## 2021-05-31 ENCOUNTER — Ambulatory Visit
Admission: EM | Admit: 2021-05-31 | Discharge: 2021-05-31 | Disposition: A | Discharge status: Home / Self Care | Payer: Self-pay | Attending: Emergency Medicine | Admitting: Emergency Medicine

## 2021-05-31 ENCOUNTER — Other Ambulatory Visit: Payer: Self-pay

## 2021-05-31 DIAGNOSIS — F17200 Nicotine dependence, unspecified, uncomplicated: Secondary | ICD-10-CM

## 2021-05-31 DIAGNOSIS — R059 Cough, unspecified: Secondary | ICD-10-CM

## 2021-05-31 DIAGNOSIS — R0602 Shortness of breath: Secondary | ICD-10-CM

## 2021-05-31 DIAGNOSIS — J189 Pneumonia, unspecified organism: Secondary | ICD-10-CM

## 2021-05-31 LAB — RAPID INFLUENZA A&B ANTIGENS
Influenza A (ARMC): NEGATIVE
Influenza B (ARMC): NEGATIVE

## 2021-05-31 MED ORDER — BENZONATATE 100 MG PO CAPS: 100.0000 mg | ORAL_CAPSULE | Freq: Three times a day (TID) | ORAL | 0 refills | Status: AC | PRN

## 2021-05-31 MED ORDER — LEVOFLOXACIN 750 MG PO TABS: 750.0000 mg | ORAL_TABLET | Freq: Every day | ORAL | 0 refills | Status: AC

## 2021-05-31 NOTE — Discharge Instructions (Addendum)
Your chest x-ray showed you have bilateral lower lobe pneumonia.  We are placing you on Levaquin take as directed drink plenty of fluids.  Stop smoking use your inhaler as directed.  Continue home meds as directed.  If you develop worsening symptoms: shortness of breath, chest pain, unable to keep your medications down, etc. go to nearest ER for further evaluation. ?

## 2021-05-31 NOTE — ED Provider Notes (Signed)
?[facility] ? ? ? ?CSN: [id number] ?Arrival date & time: 05/31/21  1751 ? ? ?  ? ?History   ?Chief Complaint ?Chief Complaint  ?Patient presents with  ? Cough  ? Generalized Body Aches  ? Nasal Congestion  ? ? ?HPI ?April Sweeney is a 56 y.o. female.  ? ?56 year old female, April Sweeney, presents to urgent care chief complaint of cough body aches nasal congestion for 3 days.  Patient states she took 2 COVID test at home that is negative declines additional COVID testing in urgent care today request flu test instead.  Patient states she last had pneumonia 02/2021, smokes 5 cigarettes a day ? ?The history is provided by the patient. No language interpreter was used.  ? ?Past Medical History:  ?Diagnosis Date  ? COPD (chronic obstructive pulmonary disease) ([place])   ? Thyroid disease   ? ? ?Patient Active Problem List  ? Diagnosis Date Noted  ? Pneumonia of both lower lobes due to infectious organism 05/31/2021  ? Smoker 05/31/2021  ? COPD (chronic obstructive pulmonary disease) ([place]) 02/13/2021  ? Hypothyroid 02/13/2021  ? Influenza A 02/13/2021  ? Hypoxemia 02/13/2021  ? HTN (hypertension) 02/13/2021  ? Lung mass 02/13/2021  ? ? ?Past Surgical History:  ?Procedure Laterality Date  ? BREAST CYST ASPIRATION Left 1992  ? CESAREAN SECTION    ? WRIST GANGLION EXCISION    ? ? ?OB History   ? ? Gravida  ?2  ? Para  ?2  ? Term  ?   ? Preterm  ?   ? AB  ?   ? Living  ?   ?  ? ? SAB  ?   ? IAB  ?   ? Ectopic  ?   ? Multiple  ?   ? Live Births  ?   ?   ?  ?  ? ? ? ?Home Medications   ? ?Prior to Admission medications   ?Medication Sig Start Date End Date Taking? Authorizing Provider  ?albuterol (PROVENTIL HFA;VENTOLIN HFA) 108 (90 BASE) MCG/ACT inhaler Inhale 2 puffs into the lungs every 6 (six) hours as needed for wheezing or shortness of breath. Last used: 800am.   Yes [provider]  ?amLODipine (NORVASC) 5 MG tablet Take 5 mg by mouth every morning.   Yes [provider]   ?Aspirin-Acetaminophen-Caffeine (GOODY HEADACHE PO) Take 1 packet by mouth 3 (three) times daily.   Yes [provider]  ?Cholecalciferol (VITAMIN D3 PO) Take 1 tablet by mouth every morning.   Yes [provider]  ?Cyanocobalamin (VITAMIN B-12 PO) Take 1 tablet by mouth every morning.   Yes [provider]  ?ELDERBERRY PO Take 1 tablet by mouth every morning.   Yes [provider]  ?fluticasone-salmeterol (ADVAIR) 250-50 MCG/ACT AEPB Inhale 1 puff into the lungs 2 (two) times daily.   Yes [provider]  ?ibuprofen (ADVIL) 200 MG tablet Take 400 mg by mouth 2 (two) times daily as needed for headache (pain).   Yes [provider]  ?levofloxacin (LEVAQUIN) 750 MG tablet Take 1 tablet (750 mg total) by mouth daily for 5 days. [id number] Yes Honi Name, Jeanett Schlein, NP  ?levothyroxine (SYNTHROID) 88 MCG tablet Take 88 mcg by mouth daily before breakfast.   Yes [provider]  ?MAGNESIUM PO Take 1 tablet by mouth at bedtime.   Yes [provider]  ?Multiple Vitamins-Minerals (ZINC PO) Take 1 tablet by mouth at bedtime.   Yes [provider]  ?  Spacer/Aero-Holding Chambers (AEROCHAMBER PLUS) inhaler Use with inhaler 05/19/20  Yes Domenick Gong, MD  ?tiotropium (SPIRIVA) 18 MCG inhalation capsule Place 18 mcg into inhaler and inhale every morning.   Yes [provider]  ?benzonatate (TESSALON PERLES) 100 MG capsule Take 1 capsule (100 mg total) by mouth 3 (three) times daily as needed for up to 5 days for cough. 05/31/21 06/05/21  Allyson Tineo, Para March, NP  ? ? ?Family History ?Family History  ?Problem Relation Age of Onset  ? Dementia Mother   ? Cancer Father   ? Liver cancer Father   ? Breast cancer Neg Hx   ? ? ?Social History ?Social History  ? ?Tobacco Use  ? Smoking status: Every Day  ?  Packs/day: 0.25  ?  Years: 40.00  ?  Pack years: 10.00  ?  Types: Cigarettes  ? Smokeless tobacco: Never  ?Vaping Use  ? Vaping Use: Never used   ?Substance Use Topics  ? Alcohol use: No  ? Drug use: Never  ? ? ? ?Allergies   ?Percocet [oxycodone-acetaminophen] ? ? ?Review of Systems ?Review of Systems  ?Constitutional:  Positive for fever.  ?HENT:  Positive for congestion.   ?Respiratory:  Positive for cough and shortness of breath. Negative for wheezing.   ?Cardiovascular:  Negative for chest pain and palpitations.  ?Gastrointestinal:  Positive for vomiting.  ?All other systems reviewed and are negative. ? ? ?Physical Exam ?Triage Vital Signs ?ED Triage Vitals  ?Enc Vitals Group  ?   BP   ?   Pulse   ?   Resp   ?   Temp   ?   Temp src   ?   SpO2   ?   Weight   ?   Height   ?   Head Circumference   ?   Peak Flow   ?   Pain Score   ?   Pain Loc   ?   Pain Edu?   ?   Excl. in [place]?   ? ?No data found. ? ?Updated Vital Signs ?BP 105/68 (BP Location: Left Arm)   Pulse (!) 101   Temp 100.1 ?F (37.8 ?C) (Oral)   Resp 18   Ht 5\' 5"  (1.651 m)   Wt 86 lb (39 kg)   LMP 02/17/2014   SpO2 94%   BMI 14.31 kg/m?  ? ?Visual Acuity ?Right Eye Distance:   ?Left Eye Distance:   ?Bilateral Distance:   ? ?Right Eye Near:   ?Left Eye Near:    ?Bilateral Near:    ? ?Physical Exam ?Vitals and nursing note reviewed.  ?Constitutional:   ?   General: She is not in acute distress. ?   Appearance: She is well-developed and well-groomed.  ?HENT:  ?   Head: Normocephalic.  ?   Right Ear: Tympanic membrane is retracted.  ?   Left Ear: Tympanic membrane is retracted.  ?   Nose: Congestion present.  ?   Mouth/Throat:  ?   Lips: Pink.  ?   Mouth: Mucous membranes are moist.  ?   Pharynx: Oropharynx is clear.  ?Eyes:  ?   General: Lids are normal.  ?   Extraocular Movements: Extraocular movements intact.  ?   Conjunctiva/sclera: Conjunctivae normal.  ?   Pupils: Pupils are equal, round, and reactive to light.  ?Neck:  ?   Trachea: No tracheal deviation.  ?Cardiovascular:  ?   Rate and Rhythm: Regular rhythm. Tachycardia present.  ?  Pulses: Normal pulses.  ?   Heart sounds: Normal  heart sounds. No murmur heard. ?   Comments: Patient has temp of 100.1, tachycardia at 101, most likely related to temp ?Pulmonary:  ?   Effort: Pulmonary effort is normal.  ?   Breath sounds: Normal breath sounds and air entry.  ?Abdominal:  ?   General: Bowel sounds are normal.  ?   Palpations: Abdomen is soft.  ?   Tenderness: There is no abdominal tenderness.  ?Musculoskeletal:     ?   General: Normal range of motion.  ?   Cervical back: Normal range of motion.  ?Lymphadenopathy:  ?   Cervical: No cervical adenopathy.  ?Skin: ?   General: Skin is warm and dry.  ?   Findings: No rash.  ?Neurological:  ?   General: No focal deficit present.  ?   Mental Status: She is alert and oriented to person, place, and time.  ?   GCS: GCS eye subscore is 4. GCS verbal subscore is 5. GCS motor subscore is 6.  ?Psychiatric:     ?   Attention and Perception: Attention normal.     ?   Mood and Affect: Mood normal.     ?   Speech: Speech normal.     ?   Behavior: Behavior normal. Behavior is cooperative.  ? ? ? ?UC Treatments / Results  ?Labs ?(all labs ordered are listed, but only abnormal results are displayed) ?Labs Reviewed  ?RAPID INFLUENZA A&B ANTIGENS  ? ? ?EKG ? ? ?Radiology ?DG Chest 2 View ? ?Result Date: 05/31/2021 ?CLINICAL DATA:  Cough and short of breath.  Smoker EXAM: CHEST - 2 VIEW COMPARISON:  02/13/2021 FINDINGS: Severe COPD with marked hyperinflation of the lungs. Mild bibasilar airspace disease could represent pneumonia or atelectasis. No effusion. Bilateral nipple shadows noted. IMPRESSION: Severe COPD.  Mild bibasilar atelectasis or pneumonia. Electronically Signed   By: Franchot Gallo M.D.   On: 05/31/2021 18:47   ? ?Procedures ?Procedures (including critical care time) ? ?Medications Ordered in UC ?Medications - No data to display ? ?Initial Impression / Assessment and Plan / UC Course  ?I have reviewed the triage vital signs and the nursing notes. ? ?Pertinent labs & imaging results that were available  during my care of the patient were reviewed by me and considered in my medical decision making (see chart for details). ? ?  ? ?Ddx: Bilateral pneumonia, COPD, Covid ?Final Clinical Impressions(s) / UC Diagnoses  ? ?Final diagnoses:  ?Pneumo

## 2021-05-31 NOTE — ED Notes (Signed)
Pt was placed on 2L of oxygen per instruction from [place], [place]. Pt O2 has come up from 89-92% range to 94%. ?

## 2021-05-31 NOTE — ED Triage Notes (Signed)
Pt c/o chest congestion, SOB, cough, headache, vomiting, body aches, x3days.  ? ? ?Pt took 2 covid tests at home and they were negative. Pt declines covid testing and asks for a flu test to be done.  ? ?Pt states that she has pneumonia in December. ?

## 2022-04-29 ENCOUNTER — Ambulatory Visit (INDEPENDENT_AMBULATORY_CARE_PROVIDER_SITE_OTHER): Payer: Self-pay

## 2022-04-29 ENCOUNTER — Ambulatory Visit
Admission: EM | Admit: 2022-04-29 | Discharge: 2022-04-29 | Disposition: A | Discharge status: Home / Self Care | Payer: Self-pay | Attending: Emergency Medicine | Admitting: Emergency Medicine

## 2022-04-29 DIAGNOSIS — R059 Cough, unspecified: Secondary | ICD-10-CM | POA: Insufficient documentation

## 2022-04-29 DIAGNOSIS — I10 Essential (primary) hypertension: Secondary | ICD-10-CM | POA: Insufficient documentation

## 2022-04-29 DIAGNOSIS — Z1152 Encounter for screening for COVID-19: Secondary | ICD-10-CM | POA: Insufficient documentation

## 2022-04-29 DIAGNOSIS — J069 Acute upper respiratory infection, unspecified: Secondary | ICD-10-CM

## 2022-04-29 DIAGNOSIS — E039 Hypothyroidism, unspecified: Secondary | ICD-10-CM | POA: Insufficient documentation

## 2022-04-29 DIAGNOSIS — J439 Emphysema, unspecified: Secondary | ICD-10-CM | POA: Insufficient documentation

## 2022-04-29 DIAGNOSIS — R0602 Shortness of breath: Secondary | ICD-10-CM

## 2022-04-29 DIAGNOSIS — J441 Chronic obstructive pulmonary disease with (acute) exacerbation: Secondary | ICD-10-CM

## 2022-04-29 DIAGNOSIS — R062 Wheezing: Secondary | ICD-10-CM

## 2022-04-29 LAB — GROUP A STREP BY PCR: Group A Strep by PCR: NOT DETECTED

## 2022-04-29 LAB — SARS CORONAVIRUS 2 BY RT PCR: SARS Coronavirus 2 by RT PCR: NEGATIVE

## 2022-04-29 MED ORDER — IPRATROPIUM BROMIDE 0.06 % NA SOLN: 2.0000 | Freq: Four times a day (QID) | NASAL | 12 refills | Status: DC

## 2022-04-29 MED ORDER — BENZONATATE 100 MG PO CAPS: 200.0000 mg | ORAL_CAPSULE | Freq: Three times a day (TID) | ORAL | 0 refills | Status: DC

## 2022-04-29 MED ORDER — LEVOFLOXACIN 500 MG PO TABS: 500.0000 mg | ORAL_TABLET | Freq: Every day | ORAL | 0 refills | Status: DC

## 2022-04-29 MED ORDER — PREDNISONE 20 MG PO TABS: 60.0000 mg | ORAL_TABLET | Freq: Every day | ORAL | 0 refills | Status: AC

## 2022-04-29 MED ORDER — PROMETHAZINE-DM 6.25-15 MG/5ML PO SYRP: 5.0000 mL | ORAL_SOLUTION | Freq: Four times a day (QID) | ORAL | 0 refills | Status: DC | PRN

## 2022-04-29 NOTE — Discharge Instructions (Addendum)
Take the Levaquin 500 milligrams once daily for 7 days for treatment of your COPD exacerbation.  Use the Atrovent nasal spray, 2 squirts up each nostril every 6 hours, as needed for nasal congestion and nasal drainage.  Use your albuterol inhaler, 2 puffs every 4-6 hours as needed for shortness breath or wheezing.  Take the prednisone 60 mg daily at breakfast time starting today.  Use the Tessalon Perles every 8 hours as needed for cough during the day.  They will sometimes cause numbness to the base of your tongue or give you metallic taste in her mouth.  This is normal.  You will need to take them with a small sip of water.  Use the Promethazine DM cough syrup at bedtime as needed for cough and congestion.  Return for reevaluation for new or worsening symptoms.

## 2022-04-29 NOTE — ED Triage Notes (Addendum)
Pt c/o cough, wheezing onset Wednesday. Pt states cough is productive, pt also reports hx of COPD, pt also states she did a home covid test yesterday (negative result)

## 2022-04-29 NOTE — ED Provider Notes (Signed)
[name]    CSN: [id number] Arrival date & time: 04/29/22  0802      History   Chief Complaint Chief Complaint  Patient presents with   Cough   Wheezing    HPI April Sweeney is a 57 y.o. female.   HPI  9 old female here for evaluation of respiratory complaints.  The patient has a past medical history that is significant for COPD, hypertension, renal mass, bilateral lower lobe pneumonia, hypoxemia, and hypothyroidism presenting for evaluation of 3 days of productive cough for gray sputum with shortness of breath and wheezing.  She also endorses runny nose, nasal congestion, and sore throat but no fever, ear pain, GI complaints, body ache, or headaches.  She states she took a home COVID test yesterday that was negative.  She feels as though this is a COPD exacerbation.  She is a smoker.  Past Medical History:  Diagnosis Date   COPD (chronic obstructive pulmonary disease) ([place])    Thyroid disease     Patient Active Problem List   Diagnosis Date Noted   Pneumonia of both lower lobes due to infectious organism 05/31/2021   Smoker 05/31/2021   COPD (chronic obstructive pulmonary disease) ([place]) 02/13/2021   Hypothyroid 02/13/2021   Influenza A 02/13/2021   Hypoxemia 02/13/2021   HTN (hypertension) 02/13/2021   Lung mass 02/13/2021    Past Surgical History:  Procedure Laterality Date   BREAST CYST ASPIRATION Left 1992   CESAREAN SECTION     WRIST GANGLION EXCISION      OB History     Gravida  2   Para  2   Term      Preterm      AB      Living         SAB      IAB      Ectopic      Multiple      Live Births               Home Medications    Prior to Admission medications   Medication Sig Start Date End Date Taking? Authorizing Provider  albuterol (PROVENTIL HFA;VENTOLIN HFA) 108 (90 BASE) MCG/ACT inhaler Inhale 2 puffs into the lungs every 6 (six) hours as needed for wheezing or shortness of breath. Last used: 800am.   Yes  [provider]  benzonatate (TESSALON) 100 MG capsule Take 2 capsules (200 mg total) by mouth every 8 (eight) hours. 04/29/22  Yes Margarette Canada, NP  Cholecalciferol (VITAMIN D3 PO) Take 1 tablet by mouth every morning.   Yes [provider]  Cyanocobalamin (VITAMIN B-12 PO) Take 1 tablet by mouth every morning.   Yes [provider]  fluticasone-salmeterol (ADVAIR) 250-50 MCG/ACT AEPB Inhale 1 puff into the lungs 2 (two) times daily.   Yes [provider]  ibuprofen (ADVIL) 200 MG tablet Take 400 mg by mouth 2 (two) times daily as needed for headache (pain).   Yes [provider]  ipratropium (ATROVENT) 0.06 % nasal spray Place 2 sprays into both nostrils 4 (four) times daily. 04/29/22  Yes Margarette Canada, NP  levofloxacin (LEVAQUIN) 500 MG tablet Take 1 tablet (500 mg total) by mouth daily. 04/29/22  Yes Margarette Canada, NP  levothyroxine (SYNTHROID) 88 MCG tablet Take 88 mcg by mouth daily before breakfast.   Yes [provider]  predniSONE (DELTASONE) 20 MG tablet Take 3 tablets (60 mg total) by mouth daily with breakfast for 5 days.  3 tablets daily for 5 days. 04/29/22 05/04/22 Yes Margarette Canada, NP  promethazine-dextromethorphan (PROMETHAZINE-DM) 6.25-15 MG/5ML syrup Take 5 mLs by mouth 4 (four) times daily as needed. 04/29/22  Yes Margarette Canada, NP  Spacer/Aero-Holding Chambers (AEROCHAMBER PLUS) inhaler Use with inhaler 05/19/20  Yes Melynda Ripple, MD  tiotropium (SPIRIVA) 18 MCG inhalation capsule Place 18 mcg into inhaler and inhale every morning.   Yes [provider]  amLODipine (NORVASC) 5 MG tablet Take 5 mg by mouth every morning.    [provider]  Aspirin-Acetaminophen-Caffeine (GOODY HEADACHE PO) Take 1 packet by mouth 3 (three) times daily.    [provider]  ELDERBERRY PO Take 1 tablet by mouth every morning.    [provider]  MAGNESIUM PO Take 1 tablet by mouth at bedtime.    [provider]  Multiple Vitamins-Minerals (ZINC PO) Take 1 tablet by mouth at bedtime.    [provider]    Family History Family History  Problem Relation Age of Onset   Dementia Mother    Cancer Father    Liver cancer Father    Breast cancer Neg Hx     Social History Social History   Tobacco Use   Smoking status: Every Day    Packs/day: 0.25    Years: 40.00    Total pack years: 10.00    Types: Cigarettes   Smokeless tobacco: Never  Vaping Use   Vaping Use: Never used  Substance Use Topics   Alcohol use: No   Drug use: Never     Allergies   Percocet [oxycodone-acetaminophen]   Review of Systems Review of Systems  Constitutional:  Negative for fever.  HENT:  Positive for congestion, rhinorrhea and sore throat. Negative for ear pain.   Respiratory:  Positive for cough, shortness of breath and wheezing.   Gastrointestinal:  Negative for diarrhea, nausea and vomiting.  Musculoskeletal:  Negative for arthralgias and myalgias.  Neurological:  Negative for headaches.     Physical Exam Triage Vital Signs ED Triage Vitals  Enc Vitals Group     BP      Pulse      Resp      Temp      Temp src      SpO2      Weight      Height      Head Circumference      Peak Flow      Pain Score      Pain Loc      Pain Edu?      Excl. in [place]?    No data found.  Updated Vital Signs BP (!) 160/99 (BP Location: Left Arm)   Pulse 90   Temp 98 F (36.7 C) (Oral)   Ht '5\' 5"'$  (1.651 m)   Wt 82 lb (37.2 kg)   LMP 02/17/2014   SpO2 100%   BMI 13.65 kg/m   Visual Acuity Right Eye Distance:   Left Eye Distance:   Bilateral Distance:    Right Eye Near:   Left Eye Near:    Bilateral Near:     Physical Exam Vitals and nursing note reviewed.  Constitutional:      Appearance: Normal appearance. She is not ill-appearing.  HENT:     Head: Normocephalic and atraumatic.     Right Ear: Tympanic membrane, ear canal and external ear normal. There is no impacted cerumen.      Left Ear: Tympanic membrane, ear canal and external  ear normal. There is no impacted cerumen.     Nose: Congestion and rhinorrhea present.     Comments: Nasal mucosa is erythematous and edematous with clear rhinorrhea in both nares.    Mouth/Throat:     Mouth: Mucous membranes are moist.     Pharynx: Oropharynx is clear. Posterior oropharyngeal erythema present. No oropharyngeal exudate.     Comments: Bilateral tonsillar pillars are erythematous and injected but no exudate.  Posterior oropharynx is also erythematous with injection and clear postnasal drip. Cardiovascular:     Rate and Rhythm: Normal rate and regular rhythm.     Pulses: Normal pulses.     Heart sounds: Normal heart sounds. No murmur heard.    No friction rub. No gallop.  Pulmonary:     Effort: Pulmonary effort is normal.     Breath sounds: Normal breath sounds. No wheezing, rhonchi or rales.     Comments: No adventitious lung sounds are auscultated in any of the lung fields, however, she has decreased air movement diffusely. Musculoskeletal:     Cervical back: Normal range of motion and neck supple.  Lymphadenopathy:     Cervical: No cervical adenopathy.  Skin:    General: Skin is warm and dry.     Capillary Refill: Capillary refill takes less than 2 seconds.     Findings: No rash.  Neurological:     General: No focal deficit present.     Mental Status: She is alert and oriented to person, place, and time.  Psychiatric:        Mood and Affect: Mood normal.        Behavior: Behavior normal.        Thought Content: Thought content normal.        Judgment: Judgment normal.      UC Treatments / Results  Labs (all labs ordered are listed, but only abnormal results are displayed) Labs Reviewed  GROUP A STREP BY PCR  SARS CORONAVIRUS 2 BY RT PCR    EKG   Radiology DG Chest 2 View  Result Date: 04/29/2022 CLINICAL DATA:  57 year old female with productive cough, shortness of breath and wheezing. COPD. EXAM:  CHEST - 2 VIEW COMPARISON:  Chest radiographs 05/11/2021 and earlier. FINDINGS: PA and lateral views at 0912 hours. Chronic very large lung volumes with emphysema demonstrated previously by CT. Incidental nipple shadows. Chronic calcified right hilar lymph node. Other mediastinal contours remain within normal limits. Visualized tracheal air column is within normal limits. No pneumothorax, pleural effusion or acute pulmonary opacity. No acute osseous abnormality identified. Negative visible bowel gas. IMPRESSION: Chronic severe COPD, Emphysema (ICD10-J43.9). No acute cardiopulmonary abnormality. Electronically Signed   By: Genevie Ann M.D.   On: 04/29/2022 09:21    Procedures Procedures (including critical care time)  Medications Ordered in UC Medications - No data to display  Initial Impression / Assessment and Plan / UC Course  I have reviewed the triage vital signs and the nursing notes.  Pertinent labs & imaging results that were available during my care of the patient were reviewed by me and considered in my medical decision making (see chart for details).   Patient is a very pleasant, nontoxic-appearing 10 old female here for evaluation of respiratory complaints as outlined in HPI above.  Vital signs show a mildly elevated blood pressure 160/99 but she is afebrile with a normal heart rate and room air oxygen saturation of 100%.  She does not demonstrate dyspnea or tachypnea and is able to  speak in full sentences.  Her exam reveals inflamed upper respiratory tract with clear rhinorrhea and clear postnasal drip.  Also no tonsillar edema and erythema.  Her lungs are diffusely decreased.  Given her upper respiratory symptoms I will order a COVID PCR as well as a strep PCR.  She has had symptoms for 3 days so she is outside the window for Tamiflu so I will not swab her at this time, plus she has been afebrile.  With her history of COPD and productive cough I will order a chest x-ray.  Radiology impression  of chest x-ray states chronic severe COPD, emphysema.  No acute cardiopulmonary abnormality though they do notice a chronic calcified right hilar lymph node.  Strep a PCR is negative.  COVID PCR is negative.  I will discharge patient on the diagnosis of COPD exacerbation started on Levaquin 500 milligrams once daily for 7 days along with 60 mg prednisone daily for 5 days.  Encouraged her to continue to use her albuterol inhaler and spacer and I will also prescribe Tessalon Perles and Promethazine DM cough syrup that she can use as needed for cough and congestion.  Atrovent nasal spray as well to help with the nasal congestion.  Final Clinical Impressions(s) / UC Diagnoses   Final diagnoses:  Upper respiratory tract infection, unspecified type  COPD exacerbation ([place])     Discharge Instructions      Take the Levaquin 500 milligrams once daily for 7 days for treatment of your COPD exacerbation.  Use the Atrovent nasal spray, 2 squirts up each nostril every 6 hours, as needed for nasal congestion and nasal drainage.  Use your albuterol inhaler, 2 puffs every 4-6 hours as needed for shortness breath or wheezing.  Take the prednisone 60 mg daily at breakfast time starting today.  Use the Tessalon Perles every 8 hours as needed for cough during the day.  They will sometimes cause numbness to the base of your tongue or give you metallic taste in her mouth.  This is normal.  You will need to take them with a small sip of water.  Use the Promethazine DM cough syrup at bedtime as needed for cough and congestion.  Return for reevaluation for new or worsening symptoms.      ED Prescriptions     Medication Sig Dispense Auth. Provider   benzonatate (TESSALON) 100 MG capsule Take 2 capsules (200 mg total) by mouth every 8 (eight) hours. 21 capsule Margarette Canada, NP   ipratropium (ATROVENT) 0.06 % nasal spray Place 2 sprays into both nostrils 4 (four) times daily. 15 mL Margarette Canada, NP    levofloxacin (LEVAQUIN) 500 MG tablet Take 1 tablet (500 mg total) by mouth daily. 7 tablet Margarette Canada, NP   predniSONE (DELTASONE) 20 MG tablet Take 3 tablets (60 mg total) by mouth daily with breakfast for 5 days. 3 tablets daily for 5 days. 15 tablet Margarette Canada, NP   promethazine-dextromethorphan (PROMETHAZINE-DM) 6.25-15 MG/5ML syrup Take 5 mLs by mouth 4 (four) times daily as needed. 118 mL Margarette Canada, NP      PDMP not reviewed this encounter.   Margarette Canada, NP 04/29/22 [phone]

## 2022-05-05 ENCOUNTER — Telehealth: Payer: Self-pay

## 2022-05-05 ENCOUNTER — Other Ambulatory Visit: Payer: Self-pay

## 2022-05-05 DIAGNOSIS — Z1211 Encounter for screening for malignant neoplasm of colon: Secondary | ICD-10-CM

## 2022-05-05 DIAGNOSIS — R195 Other fecal abnormalities: Secondary | ICD-10-CM

## 2022-05-05 MED ORDER — GOLYTELY 236 G PO SOLR: 4000.0000 mL | Freq: Once | ORAL | 0 refills | Status: AC

## 2022-05-05 NOTE — Telephone Encounter (Signed)
Gastroenterology Pre-Procedure Review  Request Date: 05/16/22 Requesting Physician: Dr. Marius Ditch  PATIENT REVIEW QUESTIONS: The patient responded to the following health history questions as indicated:    1. Are you having any GI issues?  No GI Issues present.  Referral noted Screening colonoscopy as this is patients 1st colonoscopy, she has a positive fecal occult 2. Do you have a personal history of Polyps? no 3. Do you have a family history of Colon Cancer or Polyps? no 4. Diabetes Mellitus? no 5. Joint replacements in the past 12 months?no 6. Major health problems in the past 3 months?no 7. Any artificial heart valves, MVP, or defibrillator? no    MEDICATIONS & ALLERGIES:    Patient reports the following regarding taking any anticoagulation/antiplatelet therapy:   Plavix, Coumadin, Eliquis, Xarelto, Lovenox, Pradaxa, Brilinta, or Effient? no Aspirin? no  Patient confirms/reports the following medications:  Current Outpatient Medications  Medication Sig Dispense Refill   albuterol (PROVENTIL HFA;VENTOLIN HFA) 108 (90 BASE) MCG/ACT inhaler Inhale 2 puffs into the lungs every 6 (six) hours as needed for wheezing or shortness of breath. Last used: 800am.     amLODipine (NORVASC) 5 MG tablet Take 5 mg by mouth every morning.     Aspirin-Acetaminophen-Caffeine (GOODY HEADACHE PO) Take 1 packet by mouth 3 (three) times daily.     benzonatate (TESSALON) 100 MG capsule Take 2 capsules (200 mg total) by mouth every 8 (eight) hours. 21 capsule 0   Cholecalciferol (VITAMIN D3 PO) Take 1 tablet by mouth every morning.     Cyanocobalamin (VITAMIN B-12 PO) Take 1 tablet by mouth every morning.     ELDERBERRY PO Take 1 tablet by mouth every morning.     fluticasone-salmeterol (ADVAIR) 250-50 MCG/ACT AEPB Inhale 1 puff into the lungs 2 (two) times daily.     ibuprofen (ADVIL) 200 MG tablet Take 400 mg by mouth 2 (two) times daily as needed for headache (pain).     ipratropium (ATROVENT) 0.06 % nasal  spray Place 2 sprays into both nostrils 4 (four) times daily. 15 mL 12   levofloxacin (LEVAQUIN) 500 MG tablet Take 1 tablet (500 mg total) by mouth daily. 7 tablet 0   levothyroxine (SYNTHROID) 88 MCG tablet Take 88 mcg by mouth daily before breakfast.     MAGNESIUM PO Take 1 tablet by mouth at bedtime.     Multiple Vitamins-Minerals (ZINC PO) Take 1 tablet by mouth at bedtime.     promethazine-dextromethorphan (PROMETHAZINE-DM) 6.25-15 MG/5ML syrup Take 5 mLs by mouth 4 (four) times daily as needed. 118 mL 0   Spacer/Aero-Holding Chambers (AEROCHAMBER PLUS) inhaler Use with inhaler 1 each 2   tiotropium (SPIRIVA) 18 MCG inhalation capsule Place 18 mcg into inhaler and inhale every morning.     No current facility-administered medications for this visit.    Patient confirms/reports the following allergies:  Allergies  Allergen Reactions   Percocet [Oxycodone-Acetaminophen] Hives    No orders of the defined types were placed in this encounter.   AUTHORIZATION INFORMATION Primary Insurance: 1D#: Group #:  Secondary Insurance: 1D#: Group #:  SCHEDULE INFORMATION: Date: 05/16/22 Time: Location: [facility]

## 2022-05-07 ENCOUNTER — Ambulatory Visit
Admission: EM | Admit: 2022-05-07 | Discharge: 2022-05-07 | Disposition: A | Discharge status: Home / Self Care | Payer: Self-pay

## 2022-05-07 ENCOUNTER — Encounter: Payer: Self-pay | Admitting: Emergency Medicine

## 2022-05-07 DIAGNOSIS — J441 Chronic obstructive pulmonary disease with (acute) exacerbation: Secondary | ICD-10-CM

## 2022-05-07 DIAGNOSIS — B349 Viral infection, unspecified: Secondary | ICD-10-CM

## 2022-05-07 DIAGNOSIS — R051 Acute cough: Secondary | ICD-10-CM

## 2022-05-07 NOTE — ED Triage Notes (Signed)
Patient c/o ongoing productive cough and chest congestion for over a week.  Patient states that she finished her antibiotic.  Patient denies recent fevers.

## 2022-05-07 NOTE — ED Provider Notes (Signed)
[name]    CSN: [id number] Arrival date & time: 05/07/22  0813      History   Chief Complaint Chief Complaint  Patient presents with   Cough    HPI April Sweeney is a 57 y.o. female with history of COPD (who continues to smoke) presenting for cough, congestion, and fatigue. Reports not feeling better or worse from onset.   Patient seen in this department 04/29/2022.  Negative strep and COVID testing.  Chest x-ray without any evidence of pneumonia.  Patient was treated with benzonatate 200 mg 3 times daily, Atrovent nasal spray 2 sprays 4 times daily, Promethazine DM 5 mL 4 times daily as needed for cough, 60 mg prednisone x 5 days and 500 mg Levaquin x 7 days for COPD exacerbation.  Patient reported being ill for 3 days at the time of her last visit.  At this point, she has been sick for approximately 1-1/2 weeks. She states that she uses at home Spiriva and Advair and also uses albuterol as needed.   Patient denies fever.  She says that her discolored sputum is now clear.  She denies ear pain, sinus pain or sore throat.  No report of any chest pain.  Reports that she had some wheezing yesterday and felt short of breath but used inhaler and felt better.  She says she is terrified that she will get pneumonia and request to have another antibiotic.  She says that she does not have another antibiotic she knows that she will get pneumonia.  She says that she "took all the medication he gave me" and it has not helped."    HPI  Past Medical History:  Diagnosis Date   COPD (chronic obstructive pulmonary disease) ([place])    Thyroid disease     Patient Active Problem List   Diagnosis Date Noted   Pneumonia of both lower lobes due to infectious organism 05/31/2021   Smoker 05/31/2021   COPD (chronic obstructive pulmonary disease) ([place]) 02/13/2021   Hypothyroid 02/13/2021   Influenza A 02/13/2021   Hypoxemia 02/13/2021   HTN (hypertension) 02/13/2021   Lung mass 02/13/2021     Past Surgical History:  Procedure Laterality Date   BREAST CYST ASPIRATION Left 1992   CESAREAN SECTION     WRIST GANGLION EXCISION      OB History     Gravida  2   Para  2   Term      Preterm      AB      Living         SAB      IAB      Ectopic      Multiple      Live Births               Home Medications    Prior to Admission medications   Medication Sig Start Date End Date Taking? Authorizing Provider  albuterol (PROVENTIL HFA;VENTOLIN HFA) 108 (90 BASE) MCG/ACT inhaler Inhale 2 puffs into the lungs every 6 (six) hours as needed for wheezing or shortness of breath. Last used: 800am.    [provider]  amLODipine (NORVASC) 5 MG tablet Take 5 mg by mouth every morning.    [provider]  Aspirin-Acetaminophen-Caffeine (GOODY HEADACHE PO) Take 1 packet by mouth 3 (three) times daily.    [provider]  benzonatate (TESSALON) 100 MG capsule Take 2 capsules (200 mg total) by mouth every 8 (eight) hours. 04/29/22  Margarette Canada, NP  Cholecalciferol (VITAMIN D3 PO) Take 1 tablet by mouth every morning.    [provider]  Cyanocobalamin (VITAMIN B-12 PO) Take 1 tablet by mouth every morning.    [provider]  ELDERBERRY PO Take 1 tablet by mouth every morning.    [provider]  fluticasone-salmeterol (ADVAIR) 250-50 MCG/ACT AEPB Inhale 1 puff into the lungs 2 (two) times daily.    [provider]  ibuprofen (ADVIL) 200 MG tablet Take 400 mg by mouth 2 (two) times daily as needed for headache (pain).    [provider]  ipratropium (ATROVENT) 0.06 % nasal spray Place 2 sprays into both nostrils 4 (four) times daily. 04/29/22   Margarette Canada, NP  levofloxacin (LEVAQUIN) 500 MG tablet Take 1 tablet (500 mg total) by mouth daily. 04/29/22   Margarette Canada, NP  levothyroxine (SYNTHROID) 88 MCG tablet Take 88 mcg by mouth daily before breakfast.    [provider]  MAGNESIUM PO Take 1  tablet by mouth at bedtime.    [provider]  Multiple Vitamins-Minerals (ZINC PO) Take 1 tablet by mouth at bedtime.    [provider]  promethazine-dextromethorphan (PROMETHAZINE-DM) 6.25-15 MG/5ML syrup Take 5 mLs by mouth 4 (four) times daily as needed. 04/29/22   Margarette Canada, NP  Spacer/Aero-Holding Josiah Lobo (AEROCHAMBER PLUS) inhaler Use with inhaler 05/19/20   Melynda Ripple, MD  tiotropium (SPIRIVA) 18 MCG inhalation capsule Place 18 mcg into inhaler and inhale every morning.    [provider]    Family History Family History  Problem Relation Age of Onset   Dementia Mother    Cancer Father    Liver cancer Father    Breast cancer Neg Hx     Social History Social History   Tobacco Use   Smoking status: Every Day    Packs/day: 0.25    Years: 40.00    Total pack years: 10.00    Types: Cigarettes   Smokeless tobacco: Never  Vaping Use   Vaping Use: Never used  Substance Use Topics   Alcohol use: No   Drug use: Never     Allergies   Percocet [oxycodone-acetaminophen]   Review of Systems Review of Systems  Constitutional:  Positive for fatigue. Negative for chills, diaphoresis and fever.  HENT:  Positive for congestion and sore throat. Negative for ear pain, rhinorrhea, sinus pressure and sinus pain.   Respiratory:  Positive for cough, shortness of breath and wheezing. Negative for chest tightness.   Cardiovascular:  Negative for chest pain.  Gastrointestinal:  Negative for abdominal pain, nausea and vomiting.  Musculoskeletal:  Negative for arthralgias and myalgias.  Skin:  Negative for rash.  Neurological:  Negative for weakness and headaches.  Hematological:  Negative for adenopathy.     Physical Exam Triage Vital Signs ED Triage Vitals  Enc Vitals Group     BP      Pulse      Resp      Temp      Temp src      SpO2      Weight      Height      Head Circumference      Peak Flow      Pain Score      Pain Loc       Pain Edu?      Excl. in [place]?    No data found.  Updated Vital Signs BP (!) 141/89 (BP Location: Left Arm)  Pulse 83   Temp 98.2 F (36.8 C) (Oral)   Resp 15   Ht '5\' 5"'$  (1.651 m)   Wt 82 lb 0.2 oz (37.2 kg)   LMP 02/17/2014   SpO2 95%   BMI 13.65 kg/m       Physical Exam Vitals and nursing note reviewed.  Constitutional:      General: She is not in acute distress.    Appearance: She is not ill-appearing or toxic-appearing.     Comments: Patient is very thin.  HENT:     Head: Normocephalic and atraumatic.     Right Ear: Tympanic membrane, ear canal and external ear normal.     Left Ear: Tympanic membrane, ear canal and external ear normal.     Nose: Congestion present.     Right Sinus: No maxillary sinus tenderness or frontal sinus tenderness.     Left Sinus: No maxillary sinus tenderness or frontal sinus tenderness.     Mouth/Throat:     Mouth: Mucous membranes are moist.     Pharynx: Oropharynx is clear. Posterior oropharyngeal erythema present.  Eyes:     General: No scleral icterus.       Right eye: No discharge.        Left eye: No discharge.     Conjunctiva/sclera: Conjunctivae normal.  Cardiovascular:     Rate and Rhythm: Normal rate and regular rhythm.     Heart sounds: Normal heart sounds.  Pulmonary:     Effort: Pulmonary effort is normal. No respiratory distress.     Breath sounds: Normal breath sounds. No wheezing, rhonchi or rales.  Musculoskeletal:     Cervical back: Neck supple.  Skin:    General: Skin is dry.  Neurological:     General: No focal deficit present.     Mental Status: She is alert. Mental status is at baseline.     Motor: No weakness.     Gait: Gait normal.  Psychiatric:        Mood and Affect: Mood normal.        Behavior: Behavior normal.        Thought Content: Thought content normal.      UC Treatments / Results  Labs (all labs ordered are listed, but only abnormal results are displayed) Labs Reviewed - No data to  display  EKG   Radiology No results found.  Procedures Procedures (including critical care time)  Medications Ordered in UC Medications - No data to display  Initial Impression / Assessment and Plan / UC Course  I have reviewed the triage vital signs and the nursing notes.  Pertinent labs & imaging results that were available during my care of the patient were reviewed by me and considered in my medical decision making (see chart for details).   57 year old female with history of COPD and continued tobacco abuse presents for 10-day history of cough and congestion.  She was seen here on 04/29/2022 and prescribed 7-day course of Levaquin and a high dose 60 mg prednisone for 5 days.  She was also given Promethazine DM, benzonatate and Atrovent nasal spray.  Reports no improvement or worsening of condition since and request another antibiotic.  She had a chest x-ray performed at previous visit which did not show pneumonia.  Her COVID testing and strep testing were all negative.  Blood pressure slightly elevated at 141/89.  Other vitals normal and stable.  She is in no acute distress.  She is very thin but appears overall  well.  On exam she has nasal congestion without drainage and erythema posterior pharynx.  Chest clear to auscultation the heart regular rate and rhythm.  Advised patient that I reviewed her chart from her previous visit and explained to her that she has already taken the strongest antibiotic for sinusitis and to cover her for pneumonia/COPD exacerbation.  Explained that she also just completed high-dose of corticosteroids.  Her vital signs all look good.  She does not have a fever.  Lungs are clear and oxygen is normal.  Very very low suspicion for pneumonia at this time given her previous x-ray was negative and she has been on this antibiotic that she completed yesterday.  Explained to her that there is no indication for another antibiotic at this time or even prednisone.  Advised  that she use her inhalers as needed for wheezing and shortness of breath.  Offered to send a stronger cough medication to the pharmacy such as Cheratussin but she says that she only wants an antibiotic.  She says if she does not get antibiotic she will just come back in a week sicker and with pneumonia.  Explained to patient that I cannot prevent pneumonia and the antibiotic is not at all indicated at this time based on her physical exam.  Explained that if she develops a fever or her sputum becomes discolored again, she has chest pain, increased shortness of breath, decreased oxygen saturations, increased weakness then she needs to be seen again either here or in the emergency department.  Thoroughly discussed signs and symptoms of pneumonia and sinus infections with patient.  Advised her that her COPD is still flaring up and she likely has a viral illness that will take some time to get over.  Again, offered to prescribe her a stronger cough medication to help but she declines demanding antibiotic.  Explained to patient that I was not going to give her an antibiotic today.  She became very upset, stood up to leave and said to me "I need a real doctor and not some PA."  Guided patient to the accident and advised to go to ER if symptoms worsen.   Final Clinical Impressions(s) / UC Diagnoses   Final diagnoses:  Viral illness  Acute cough  COPD exacerbation (HCC)     Discharge Instructions      -No indication for antibiotic today but if symptoms worsen such as fever develops, discolored sputum, increased shortness of breath, weakness, return or go to ER. - You declined the cough medication I was going to prescribe to you today.  Continue Promethazine DM and plain Mucinex.  Increase rest and fluids and continue use inhalers.     ED Prescriptions   None    PDMP not reviewed this encounter.   Danton Clap, PA-C 05/07/22 [phone]

## 2022-05-07 NOTE — Discharge Instructions (Addendum)
-  No indication for antibiotic today but if symptoms worsen such as fever develops, discolored sputum, increased shortness of breath, weakness, return or go to ER. - You declined the cough medication I was going to prescribe to you today.  Continue Promethazine DM and plain Mucinex.  Increase rest and fluids and continue use inhalers.

## 2022-05-09 ENCOUNTER — Encounter: Payer: Self-pay | Admitting: Gastroenterology

## 2022-05-16 ENCOUNTER — Ambulatory Visit: Payer: Self-pay | Admitting: Anesthesiology

## 2022-05-16 ENCOUNTER — Encounter: Payer: Self-pay | Admitting: Gastroenterology

## 2022-05-16 ENCOUNTER — Encounter
Admission: RE | Disposition: A | Discharge status: Home / Self Care | Payer: Self-pay | Source: Home / Self Care | Attending: Gastroenterology

## 2022-05-16 ENCOUNTER — Other Ambulatory Visit: Payer: Self-pay

## 2022-05-16 ENCOUNTER — Ambulatory Visit
Admission: RE | Admit: 2022-05-16 | Discharge: 2022-05-16 | Disposition: A | Discharge status: Home / Self Care | Payer: Self-pay | Attending: Gastroenterology | Admitting: Gastroenterology

## 2022-05-16 DIAGNOSIS — E039 Hypothyroidism, unspecified: Secondary | ICD-10-CM | POA: Insufficient documentation

## 2022-05-16 DIAGNOSIS — R195 Other fecal abnormalities: Secondary | ICD-10-CM | POA: Insufficient documentation

## 2022-05-16 DIAGNOSIS — J449 Chronic obstructive pulmonary disease, unspecified: Secondary | ICD-10-CM | POA: Insufficient documentation

## 2022-05-16 DIAGNOSIS — Z79899 Other long term (current) drug therapy: Secondary | ICD-10-CM | POA: Insufficient documentation

## 2022-05-16 DIAGNOSIS — D122 Benign neoplasm of ascending colon: Secondary | ICD-10-CM | POA: Insufficient documentation

## 2022-05-16 DIAGNOSIS — K644 Residual hemorrhoidal skin tags: Secondary | ICD-10-CM | POA: Insufficient documentation

## 2022-05-16 DIAGNOSIS — Z1211 Encounter for screening for malignant neoplasm of colon: Secondary | ICD-10-CM

## 2022-05-16 DIAGNOSIS — D120 Benign neoplasm of cecum: Secondary | ICD-10-CM | POA: Insufficient documentation

## 2022-05-16 DIAGNOSIS — D125 Benign neoplasm of sigmoid colon: Secondary | ICD-10-CM | POA: Insufficient documentation

## 2022-05-16 DIAGNOSIS — F17210 Nicotine dependence, cigarettes, uncomplicated: Secondary | ICD-10-CM | POA: Insufficient documentation

## 2022-05-16 DIAGNOSIS — I10 Essential (primary) hypertension: Secondary | ICD-10-CM | POA: Insufficient documentation

## 2022-05-16 HISTORY — PX: POLYPECTOMY: SHX5525

## 2022-05-16 HISTORY — DX: Presence of dental prosthetic device (complete) (partial): Z97.2

## 2022-05-16 HISTORY — PX: COLONOSCOPY WITH PROPOFOL: SHX5780

## 2022-05-16 SURGERY — COLONOSCOPY WITH PROPOFOL
Anesthesia: General | Site: Rectum

## 2022-05-16 MED ORDER — SODIUM CHLORIDE 0.9 % IV SOLN: INTRAVENOUS | Status: DC

## 2022-05-16 MED ORDER — PROPOFOL 500 MG/50ML IV EMUL
INTRAVENOUS | Status: DC | PRN
  Administered 2022-05-16: 75 ug/kg/min via INTRAVENOUS

## 2022-05-16 MED ORDER — LACTATED RINGERS IV SOLN: INTRAVENOUS | Status: DC

## 2022-05-16 MED ORDER — PROPOFOL 10 MG/ML IV BOLUS
INTRAVENOUS | Status: DC | PRN
  Administered 2022-05-16: 40 mg via INTRAVENOUS
  Administered 2022-05-16: 50 mg via INTRAVENOUS

## 2022-05-16 MED ORDER — STERILE WATER FOR IRRIGATION IR SOLN
Status: DC | PRN
  Administered 2022-05-16: 1

## 2022-05-16 MED ORDER — LIDOCAINE HCL (CARDIAC) PF 100 MG/5ML IV SOSY
PREFILLED_SYRINGE | INTRAVENOUS | Status: DC | PRN
  Administered 2022-05-16: 40 mg via INTRAVENOUS

## 2022-05-16 SURGICAL SUPPLY — 25 items
CLIP HMST 235XBRD CATH ROT (MISCELLANEOUS) IMPLANT
CLIP RESOLUTION 360 11X235 (MISCELLANEOUS)
ELECT REM PT RETURN 9FT ADLT (ELECTROSURGICAL)
ELECTRODE REM PT RTRN 9FT ADLT (ELECTROSURGICAL) IMPLANT
FCP ESCP3.2XJMB 240X2.8X (MISCELLANEOUS)
FORCEPS BIOP RAD 4 LRG CAP 4 (CUTTING FORCEPS) IMPLANT
FORCEPS BIOP RJ4 240 W/NDL (MISCELLANEOUS)
FORCEPS ESCP3.2XJMB 240X2.8X (MISCELLANEOUS) IMPLANT
GOWN CVR UNV OPN BCK APRN NK (MISCELLANEOUS) ×4 IMPLANT
GOWN ISOL THUMB LOOP REG UNIV (MISCELLANEOUS) ×4
INJECTOR VARIJECT VIN23 (MISCELLANEOUS) IMPLANT
KIT DEFENDO VALVE AND CONN (KITS) IMPLANT
KIT PRC NS LF DISP ENDO (KITS) ×2 IMPLANT
KIT PROCEDURE OLYMPUS (KITS) ×2
MANIFOLD NEPTUNE II (INSTRUMENTS) ×2 IMPLANT
MARKER SPOT ENDO TATTOO 5ML (MISCELLANEOUS) IMPLANT
PROBE APC STR FIRE (PROBE) IMPLANT
RETRIEVER NET ROTH 2.5X230 LF (MISCELLANEOUS) IMPLANT
SNARE COLD EXACTO (MISCELLANEOUS) IMPLANT
SNARE SHORT THROW 13M SML OVAL (MISCELLANEOUS) IMPLANT
SNARE SHORT THROW 30M LRG OVAL (MISCELLANEOUS) IMPLANT
SNARE SNG USE RND 15MM (INSTRUMENTS) IMPLANT
TRAP ETRAP POLY (MISCELLANEOUS) IMPLANT
VARIJECT INJECTOR VIN23 (MISCELLANEOUS)
WATER STERILE IRR 250ML POUR (IV SOLUTION) ×2 IMPLANT

## 2022-05-16 NOTE — Op Note (Signed)
[facility] Patient Name: April Sweeney Procedure Date: 05/16/2022 10:27 AM MRN: [id number] Account #: [id number] Date of Birth: May 22, 1965 Admit Type: Outpatient Age: 57 Room: [facility] OR ROOM 01 Gender: Female Note Status: [place] Instrument Name: [id number] Procedure:             Colonoscopy Indications:           This is the patient's first colonoscopy, Positive                         fecal immunochemical test Providers:             Lin Landsman MD, MD Medicines:             General Anesthesia Complications:         No immediate complications. Estimated blood loss: None. Procedure:             Pre-Anesthesia Assessment:                        - Prior to the procedure, a History and Physical was                         performed, and patient medications and allergies were                         reviewed. The patient is competent. The risks and                         benefits of the procedure and the sedation options and                         risks were discussed with the patient. All questions                         were answered and informed consent was obtained.                         Patient identification and proposed procedure were                         verified by the physician, the nurse, the                         anesthesiologist, the anesthetist and the technician                         in the pre-procedure area in the procedure room in the                         endoscopy suite. Mental Status Examination: alert and                         oriented. Airway Examination: normal oropharyngeal                         airway and neck mobility. Respiratory Examination:                         clear to auscultation. CV Examination:  normal.                         Prophylactic Antibiotics: The patient does not require                         prophylactic antibiotics. Prior Anticoagulants: The                         patient has  taken no anticoagulant or antiplatelet                         agents. ASA Grade Assessment: III - A patient with                         severe systemic disease. After reviewing the risks and                         benefits, the patient was deemed in satisfactory                         condition to undergo the procedure. The anesthesia                         plan was to use general anesthesia. Immediately prior                         to administration of medications, the patient was                         re-assessed for adequacy to receive sedatives. The                         heart rate, respiratory rate, oxygen saturations,                         blood pressure, adequacy of pulmonary ventilation, and                         response to care were monitored throughout the                         procedure. The physical status of the patient was                         re-assessed after the procedure.                        After obtaining informed consent, the colonoscope was                         passed under direct vision. Throughout the procedure,                         the patient's blood pressure, pulse, and oxygen                         saturations were monitored continuously. The  Colonoscope was introduced through the anus and                         advanced to the the terminal ileum, with                         identification of the appendiceal orifice and IC                         valve. The colonoscopy was performed without                         difficulty. The patient tolerated the procedure well.                         The quality of the bowel preparation was evaluated                         using the BBPS [facility] Bowel Preparation Scale) with                         scores of: Right Colon = 3, Transverse Colon = 3 and                         Left Colon = 3 (entire mucosa seen well with no                         residual staining,  small fragments of stool or opaque                         liquid). The total BBPS score equals 9. The terminal                         ileum, ileocecal valve, appendiceal orifice, and                         rectum were photographed. Findings:      Skin tags were found on perianal exam.      The terminal ileum appeared normal.      Seven sessile polyps were found in the sigmoid colon 2, ascending colon       1 and cecum 4. The polyps were 3 to 7 mm in size. These polyps were       removed with a cold snare. Resection and retrieval were complete.       Estimated blood loss: none.      Normal mucosa was found in the entire colon.      The retroflexed view of the distal rectum and anal verge was normal and       showed no anal or rectal abnormalities. Impression:            - Perianal skin tags found on perianal exam.                        - The examined portion of the ileum was normal.                        - Seven 3 to 7 mm polyps in  the sigmoid colon, in the                         ascending colon and in the cecum, removed with a cold                         snare. Resected and retrieved.                        - Normal mucosa in the entire examined colon.                        - The distal rectum and anal verge are normal on                         retroflexion view. Recommendation:        - Discharge patient to home (with escort).                        - Resume previous diet today.                        - Continue present medications.                        - Await pathology results.                        - Repeat colonoscopy in 3 years for surveillance of                         multiple polyps.                        - Return to my office in 1 month for workup of weight                         loss. Procedure Code(s):     --- Professional ---                        [phone], Colonoscopy, flexible; with removal of                         tumor(s), polyp(s), or other lesion(s)  by snare                         technique Diagnosis Code(s):     --- Professional ---                        D12.5, Benign neoplasm of sigmoid colon                        D12.2, Benign neoplasm of ascending colon                        D12.0, Benign neoplasm of cecum                        K64.4, Residual hemorrhoidal skin tags  R19.5, Other fecal abnormalities CPT copyright 2022 American Medical Association. All rights reserved. The codes documented in this report are preliminary and upon coder review may  be revised to meet current compliance requirements. Dr. Ulyess Mort Lin Landsman MD, MD 05/16/2022 11:12:19 AM This report has been signed electronically. Number of Addenda: 0 Note Initiated On: 05/16/2022 10:27 AM Scope Withdrawal Time: 0 hours 12 minutes 1 second  Total Procedure Duration: 0 hours 18 minutes 13 seconds  Estimated Blood Loss:  Estimated blood loss: none. Estimated blood loss: none.      [facility]

## 2022-05-16 NOTE — Transfer of Care (Signed)
Immediate Anesthesia Transfer of Care Note  Patient: April Sweeney  Procedure(s) Performed: COLONOSCOPY WITH PROPOFOL POLYPECTOMY (Rectum)  Patient Location: PACU  Anesthesia Type: General  Level of Consciousness: awake, alert  and patient cooperative  Airway and Oxygen Therapy: Patient Spontanous Breathing and Patient connected to supplemental oxygen  Post-op Assessment: Post-op Vital signs reviewed, Patient's Cardiovascular Status Stable, Respiratory Function Stable, Patent Airway and No signs of Nausea or vomiting  Post-op Vital Signs: Reviewed and stable  Complications: No notable events documented.

## 2022-05-16 NOTE — H&P (Signed)
Cephas Darby, MD [address]  [place]  [place], [place] [address]  Main: [phone]  Fax: [phone] Pager: [phone]  Primary Care Physician:  [facility], [facility] Primary Gastroenterologist:  Dr. Cephas Darby  Pre-Procedure History & Physical: HPI:  April Sweeney is a 57 y.o. female is here for an colonoscopy.   Past Medical History:  Diagnosis Date   COPD (chronic obstructive pulmonary disease) ([place])    Thyroid disease    Wears dentures    full upper, partial lower    Past Surgical History:  Procedure Laterality Date   BREAST CYST ASPIRATION Left 1992   CESAREAN SECTION     WRIST GANGLION EXCISION      Prior to Admission medications   Medication Sig Start Date End Date Taking? Authorizing Provider  albuterol (PROVENTIL HFA;VENTOLIN HFA) 108 (90 BASE) MCG/ACT inhaler Inhale 2 puffs into the lungs every 6 (six) hours as needed for wheezing or shortness of breath. Last used: 800am.   Yes [provider]  Aspirin-Acetaminophen-Caffeine (GOODY HEADACHE PO) Take 1 packet by mouth 3 (three) times daily.   Yes [provider]  Cholecalciferol (VITAMIN D3 PO) Take 1 tablet by mouth every morning. With K2   Yes [provider]  Cyanocobalamin (VITAMIN B-12 PO) Take 1 tablet by mouth every morning.   Yes [provider]  fluticasone (FLONASE) 50 MCG/ACT nasal spray Place into both nostrils daily.   Yes [provider]  fluticasone-salmeterol (ADVAIR) 250-50 MCG/ACT AEPB Inhale 1 puff into the lungs 2 (two) times daily.   Yes [provider]  levothyroxine (SYNTHROID) 88 MCG tablet Take 88 mcg by mouth daily before breakfast.   Yes [provider]  Multiple Vitamins-Minerals (ZINC PO) Take 1 tablet by mouth at bedtime.   Yes [provider]  Spacer/Aero-Holding Chambers (AEROCHAMBER PLUS) inhaler Use with inhaler 05/19/20  Yes Melynda Ripple, MD  tiotropium (SPIRIVA) 18 MCG inhalation  capsule Place 18 mcg into inhaler and inhale every morning.   Yes [provider]  benzonatate (TESSALON) 100 MG capsule Take 2 capsules (200 mg total) by mouth every 8 (eight) hours. Patient not taking: Reported on 05/09/2022 04/29/22   Margarette Canada, NP  ibuprofen (ADVIL) 200 MG tablet Take 400 mg by mouth 2 (two) times daily as needed for headache (pain). Patient not taking: Reported on 05/09/2022    [provider]  ipratropium (ATROVENT) 0.06 % nasal spray Place 2 sprays into both nostrils 4 (four) times daily. Patient not taking: Reported on 05/09/2022 04/29/22   Margarette Canada, NP  promethazine-dextromethorphan (PROMETHAZINE-DM) 6.25-15 MG/5ML syrup Take 5 mLs by mouth 4 (four) times daily as needed. Patient not taking: Reported on 05/09/2022 04/29/22   Margarette Canada, NP    Allergies as of 05/05/2022 - Review Complete 04/29/2022  Allergen Reaction Noted   Percocet [oxycodone-acetaminophen] Hives 12/31/2014    Family History  Problem Relation Age of Onset   Dementia Mother    Cancer Father    Liver cancer Father    Breast cancer Neg Hx     Social History   Socioeconomic History   Marital status: Married    Spouse name: Not on file   Number of children: Not on file   Years of education: Not on file   Highest education level: Not on file  Occupational History   Not on file  Tobacco Use   Smoking status: Every Day    Packs/day: 0.25    Years: 40.00  Total pack years: 10.00    Types: Cigarettes   Smokeless tobacco: Never   Tobacco comments:    Started smoking in early teens  Vaping Use   Vaping Use: Never used  Substance and Sexual Activity   Alcohol use: No   Drug use: Never   Sexual activity: Not on file  Other Topics Concern   Not on file  Social History Narrative   Not on file   Social Determinants of Health   Financial Resource Strain: Not on file  Food Insecurity: Not on file  Transportation Needs: Not on file  Physical Activity: Not on file   Stress: Not on file  Social Connections: Not on file  Intimate Partner Violence: Not on file    Review of Systems: See HPI, otherwise negative ROS  Physical Exam: BP 124/81   Pulse 77   Temp 98.9 F (37.2 C) (Temporal)   Resp 14   Ht '5\' 5"'$  (1.651 m)   Wt 34.9 kg   LMP 02/17/2014   SpO2 95%   BMI 12.81 kg/m  General:   Alert,  pleasant and cooperative in NAD Head:  Normocephalic and atraumatic. Neck:  Supple; no masses or thyromegaly. Lungs:  Clear throughout to auscultation.    Heart:  Regular rate and rhythm. Abdomen:  Soft, nontender and nondistended. Normal bowel sounds, without guarding, and without rebound.   Neurologic:  Alert and  oriented x4;  grossly normal neurologically.  Impression/Plan: April Sweeney is here for an colonoscopy to be performed for fecal occult test positive  Risks, benefits, limitations, and alternatives regarding  colonoscopy have been reviewed with the patient.  Questions have been answered.  All parties agreeable.   Sherri Sear, MD  05/16/2022, 10:23 AM

## 2022-05-16 NOTE — Anesthesia Postprocedure Evaluation (Signed)
Anesthesia Post Note  Patient: April Sweeney  Procedure(s) Performed: COLONOSCOPY WITH PROPOFOL POLYPECTOMY (Rectum)  Patient location during evaluation: PACU Anesthesia Type: General Level of consciousness: awake and alert Pain management: pain level controlled Vital Signs Assessment: post-procedure vital signs reviewed and stable Respiratory status: spontaneous breathing, nonlabored ventilation, respiratory function stable and patient connected to nasal cannula oxygen Cardiovascular status: blood pressure returned to baseline and stable Postop Assessment: no apparent nausea or vomiting Anesthetic complications: no  No notable events documented.   Last Vitals:  Vitals:   05/16/22 1114 05/16/22 1121  BP: 120/68 126/73  Pulse: 86 87  Resp: 14 12  Temp: 36.4 C 36.4 C  SpO2: 97% 98%    Last Pain:  Vitals:   05/16/22 1121  TempSrc:   PainSc: 0-No pain                 Dimas Millin

## 2022-05-16 NOTE — Anesthesia Procedure Notes (Signed)
Procedure Name: General with mask airway Date/Time: 05/16/2022 10:45 AM  Performed by: Hilbert Odor, CRNAPre-anesthesia Checklist: Patient identified, Emergency Drugs available, Suction available, Patient being monitored and Timeout performed Patient Re-evaluated:Patient Re-evaluated prior to induction Oxygen Delivery Method: Nasal cannula Preoxygenation: Pre-oxygenation with 100% oxygen Induction Type: IV induction

## 2022-05-16 NOTE — Anesthesia Preprocedure Evaluation (Signed)
Anesthesia Evaluation  Patient identified by MRN, date of birth, ID band Patient awake    Reviewed: Allergy & Precautions, NPO status , Patient's Chart, lab work & pertinent test results  Airway Mallampati: III  TM Distance: >3 FB Neck ROM: full    Dental  (+) Chipped   Pulmonary shortness of breath and with exertion, COPD,  COPD inhaler, Current Smoker   Pulmonary exam normal        Cardiovascular hypertension, On Medications (-) Past MI and (-) CABG Normal cardiovascular exam     Neuro/Psych negative neurological ROS  negative psych ROS   GI/Hepatic negative GI ROS, Neg liver ROS,,,  Endo/Other  Hypothyroidism    Renal/GU negative Renal ROS  negative genitourinary   Musculoskeletal   Abdominal   Peds  Hematology negative hematology ROS (+)   Anesthesia Other Findings Past Medical History: No date: COPD (chronic obstructive pulmonary disease) (HCC) No date: Thyroid disease No date: Wears dentures     Comment:  full upper, partial lower  Past Surgical History: 1992: BREAST CYST ASPIRATION; Left No date: CESAREAN SECTION No date: WRIST GANGLION EXCISION  BMI    Body Mass Index: 12.81 kg/m      Reproductive/Obstetrics negative OB ROS                             Anesthesia Physical Anesthesia Plan  ASA: 3  Anesthesia Plan: General   Post-op Pain Management: Minimal or no pain anticipated   Induction: Intravenous  PONV Risk Score and Plan: 3 and Propofol infusion, TIVA and Ondansetron  Airway Management Planned: Nasal Cannula  Additional Equipment: None  Intra-op Plan:   Post-operative Plan:   Informed Consent: I have reviewed the patients History and Physical, chart, labs and discussed the procedure including the risks, benefits and alternatives for the proposed anesthesia with the patient or authorized representative who has indicated his/her understanding and  acceptance.     Dental advisory given  Plan Discussed with: CRNA and Surgeon  Anesthesia Plan Comments: (Discussed risks of anesthesia with patient, including possibility of difficulty with spontaneous ventilation under anesthesia necessitating airway intervention, PONV, and rare risks such as cardiac or respiratory or neurological events, and allergic reactions. Discussed the role of CRNA in patient's perioperative care. Patient understands.)        Anesthesia Quick Evaluation

## 2022-05-17 ENCOUNTER — Encounter: Payer: Self-pay | Admitting: Gastroenterology

## 2022-05-18 ENCOUNTER — Encounter: Payer: Self-pay | Admitting: Gastroenterology

## 2022-05-18 LAB — SURGICAL PATHOLOGY

## 2022-05-25 ENCOUNTER — Telehealth: Payer: Self-pay

## 2022-05-25 NOTE — Telephone Encounter (Signed)
Per patient colonoscopy report patient is to make a 1 month follow up appointment to work up patient weight loss. Called patient and left a message for call back.

## 2022-05-26 NOTE — Telephone Encounter (Signed)
Called and left a message for call back  

## 2022-05-26 NOTE — Telephone Encounter (Signed)
Patient states it is a very busy time with her work because it is prom season. She states she wants to schedule a appointment in June. Informed patient our June schedule is not out yet but to give [place] a call in a couple weeks and we can get that schedule

## 2022-05-26 NOTE — Telephone Encounter (Signed)
Sent mychart message

## 2022-06-14 ENCOUNTER — Encounter: Payer: Self-pay | Admitting: Gastroenterology

## 2022-06-14 ENCOUNTER — Ambulatory Visit (INDEPENDENT_AMBULATORY_CARE_PROVIDER_SITE_OTHER): Payer: Self-pay | Admitting: Gastroenterology

## 2022-06-14 ENCOUNTER — Telehealth: Payer: Self-pay

## 2022-06-14 VITALS — BP 138/75 | HR 78 | Temp 98.7°F | Ht 65.0 in | Wt 80.2 lb

## 2022-06-14 DIAGNOSIS — D7589 Other specified diseases of blood and blood-forming organs: Secondary | ICD-10-CM

## 2022-06-14 DIAGNOSIS — R634 Abnormal weight loss: Secondary | ICD-10-CM

## 2022-06-14 NOTE — Progress Notes (Signed)
Arlyss Repress, MD [address]  [address]  [place], [place] [address]  Main: [phone]  Fax: [phone]    Gastroenterology Consultation  Referring Provider:     Jannet Askew, MD Primary Care Physician:  Jannet Askew, MD Primary Gastroenterologist:  Dr. Arlyss Repress Reason for Consultation: Unintentional weight loss        HPI:   Ezella KHALIYA ODONNELL is a 57 y.o. female referred by Dr. Jannet Askew, MD  for consultation & management of unintentional weight loss.  Patient has history of heavy tobacco use, was originally seen by me for colonoscopy for FIT positive test on 05/16/2022.  Patient reports that her highest weight was 130 pounds.  She used to lose about 1 pound a month before pandemic and after pandemic, she has been losing weight more rapidly.  She denies any abdominal pain, bloating, loss of appetite.  She denies any diarrhea.  She reports having regular bowel movements until colonoscopy.  Currently having bowel movement every other day.  Her nuclear medicine PET scan in 02/2021 was unremarkable.  She is also being treated for history of Hashimoto's thyroiditis and her levothyroxine dose has been gradually increased by her PCP.  However, her last TSH levels have been normal, 2.86 from 05/09/22 and 08/05/21 it was 2.22  She continues to smoke half pack per day  NSAIDs: None  Antiplts/Anticoagulants/Anti thrombotics: None  GI Procedures:  Colonoscopy 05/16/2022 for FIT positive Perianal skin tags found on perianal exam The examined portion of the ileum was normal Seven 3 to 7 mm polyps in the colon, resected and retrieved Normal mucosa in the entire colon Normal retroflexion   Past Medical History:  Diagnosis Date   COPD (chronic obstructive pulmonary disease)    Thyroid disease    Wears dentures    full upper, partial lower    Past Surgical History:  Procedure Laterality Date   BREAST CYST ASPIRATION Left 1992   CESAREAN SECTION     COLONOSCOPY  WITH PROPOFOL N/A 05/16/2022   Procedure: COLONOSCOPY WITH PROPOFOL;  Surgeon: Toney Reil, MD;  Location: [facility];  Service: Endoscopy;  Laterality: N/A;   POLYPECTOMY  05/16/2022   Procedure: POLYPECTOMY;  Surgeon: Toney Reil, MD;  Location: [facility];  Service: Endoscopy;;   WRIST GANGLION EXCISION       Current Outpatient Medications:    ADVAIR DISKUS 500-50 MCG/ACT AEPB, Inhale 1 puff into the lungs 2 (two) times daily., Disp: , Rfl:    albuterol (PROVENTIL HFA;VENTOLIN HFA) 108 (90 BASE) MCG/ACT inhaler, Inhale 2 puffs into the lungs every 6 (six) hours as needed for wheezing or shortness of breath. Last used: 800am., Disp: , Rfl:    Cholecalciferol (VITAMIN D3 PO), Take 1 tablet by mouth every morning. With K2, Disp: , Rfl:    Cyanocobalamin (VITAMIN B-12 PO), Take 1 tablet by mouth every morning., Disp: , Rfl:    fluticasone (FLONASE) 50 MCG/ACT nasal spray, Place into both nostrils daily., Disp: , Rfl:    levothyroxine (SYNTHROID) 88 MCG tablet, Take 88 mcg by mouth daily before breakfast., Disp: , Rfl:    Multiple Vitamins-Minerals (ZINC PO), Take 1 tablet by mouth at bedtime., Disp: , Rfl:    Spacer/Aero-Holding Chambers (AEROCHAMBER PLUS) inhaler, Use with inhaler, Disp: 1 each, Rfl: 2   tiotropium (SPIRIVA) 18 MCG inhalation capsule, Place 18 mcg into inhaler and inhale every morning., Disp: , Rfl:    Family History  Problem  Relation Age of Onset   Dementia Mother    Cancer Father    Liver cancer Father    Breast cancer Neg Hx      Social History   Tobacco Use   Smoking status: Every Day    Packs/day: 0.25    Years: 40.00    Additional pack years: 0.00    Total pack years: 10.00    Types: Cigarettes   Smokeless tobacco: Never   Tobacco comments:    Started smoking in early teens  Vaping Use   Vaping Use: Never used  Substance Use Topics   Alcohol use: No   Drug use: Never    Allergies as of 06/14/2022 - Review Complete  06/14/2022  Allergen Reaction Noted   Percocet [oxycodone-acetaminophen] Hives 12/31/2014    Review of Systems:    All systems reviewed and negative except where noted in HPI.   Physical Exam:  BP 138/75 (BP Location: Right Arm, Patient Position: Sitting, Cuff Size: Normal)   Pulse 78   Temp 98.7 F (37.1 C) (Oral)   Ht 5\' 5"  (1.651 m)   Wt 80 lb 4 oz (36.4 kg)   LMP 02/17/2014   BMI 13.35 kg/m  Patient's last menstrual period was 02/17/2014.  General:   Alert, thin built, poorly nourished, pleasant and cooperative in NAD Head:  Normocephalic and atraumatic. Eyes:  Sclera clear, no icterus.   Conjunctiva pink. Ears:  Normal auditory acuity. Nose:  No deformity, discharge, or lesions. Mouth:  No deformity or lesions,oropharynx pink & moist. Neck:  Supple; no masses or thyromegaly. Lungs:  Respirations even and unlabored.  Clear throughout to auscultation.   No wheezes, crackles, or rhonchi. No acute distress. Heart:  Regular rate and rhythm; no murmurs, clicks, rubs, or gallops. Abdomen:  Normal bowel sounds. Soft, non-tender and non-distended without masses, hepatosplenomegaly or hernias noted.  No guarding or rebound tenderness.   Rectal: Not performed Msk:  Symmetrical without gross deformities. Good, equal movement & strength bilaterally. Pulses:  Normal pulses noted. Extremities:  No clubbing or edema.  No cyanosis. Neurologic:  Alert and oriented x3;  grossly normal neurologically. Skin:  Intact without significant lesions or rashes. No jaundice. Psych:  Alert and cooperative. Normal mood and affect.  Imaging Studies: Reviewed  Assessment and Plan:   Medrith NASIRAH MUNA is a 57 y.o. female with history of chronic tobacco use, Hashimoto's thyroiditis, hypothyroidism COPD is seen in consultation for unexplained weight loss  Unexplained weight loss Recheck thyroid profile Check pancreatic fecal elastase levels Check B12 and folate levels Check celiac disease  panel   Follow up in 4 months   Arlyss Repress, MD

## 2022-06-14 NOTE — Telephone Encounter (Signed)
Those are normal values, does she know what her free T4 levels are?  RV

## 2022-06-14 NOTE — Telephone Encounter (Signed)
Patient states her TSH results are 2.86 from 05/09/22 and 08/05/21 it was 2.22

## 2022-06-15 NOTE — Telephone Encounter (Signed)
She said she only see's TSH results

## 2022-06-18 LAB — CELIAC DISEASE PANEL
Endomysial IgA: NEGATIVE
IgA/Immunoglobulin A, Serum: 242 mg/dL (ref 87–352)
Transglutaminase IgA: <2 U/mL (ref 0–3)

## 2022-06-18 LAB — TSH+FREE T4
Free T4: 0.87 ng/dL (ref 0.82–1.77)
TSH: 6.95 u[IU]/mL — ABNORMAL HIGH (ref 0.450–4.500)

## 2022-06-18 LAB — B12 AND FOLATE PANEL
Folate: 14.7 ng/mL (ref >3.0)
Vitamin B-12: >2000 pg/mL — ABNORMAL HIGH (ref 232–1245)

## 2022-06-20 ENCOUNTER — Encounter: Payer: Self-pay | Admitting: Gastroenterology

## 2022-06-24 LAB — PANCREATIC ELASTASE, FECAL: Pancreatic Elastase, Fecal: >500 ug Elast./g (ref >200)

## 2022-06-26 ENCOUNTER — Telehealth: Payer: Self-pay

## 2022-06-26 NOTE — Telephone Encounter (Signed)
Called and left a message for call back  

## 2022-06-26 NOTE — Telephone Encounter (Signed)
-----   Message from Toney Reil, MD sent at 06/25/2022  3:43 PM EDT ----- Her stool test came back normal and all other blood tests also came back normal Recommend high-protein diet, 3 meals a day Follow-up as scheduled  RV

## 2022-06-27 NOTE — Telephone Encounter (Signed)
Patient verbalized understanding of results  

## 2022-07-06 ENCOUNTER — Encounter: Payer: Self-pay | Admitting: *Deleted

## 2022-07-06 ENCOUNTER — Emergency Department
Admission: EM | Admit: 2022-07-06 | Discharge: 2022-07-06 | Disposition: A | Discharge status: Home / Self Care | Payer: Self-pay | Attending: Emergency Medicine | Admitting: Emergency Medicine

## 2022-07-06 ENCOUNTER — Emergency Department: Payer: Self-pay

## 2022-07-06 ENCOUNTER — Other Ambulatory Visit: Payer: Self-pay

## 2022-07-06 DIAGNOSIS — J441 Chronic obstructive pulmonary disease with (acute) exacerbation: Secondary | ICD-10-CM | POA: Insufficient documentation

## 2022-07-06 DIAGNOSIS — Z20822 Contact with and (suspected) exposure to covid-19: Secondary | ICD-10-CM | POA: Insufficient documentation

## 2022-07-06 DIAGNOSIS — I10 Essential (primary) hypertension: Secondary | ICD-10-CM | POA: Insufficient documentation

## 2022-07-06 DIAGNOSIS — J189 Pneumonia, unspecified organism: Secondary | ICD-10-CM

## 2022-07-06 DIAGNOSIS — E039 Hypothyroidism, unspecified: Secondary | ICD-10-CM | POA: Insufficient documentation

## 2022-07-06 DIAGNOSIS — J168 Pneumonia due to other specified infectious organisms: Secondary | ICD-10-CM | POA: Insufficient documentation

## 2022-07-06 LAB — BASIC METABOLIC PANEL
Anion gap: 10 (ref 5–15)
BUN: 14 mg/dL (ref 6–20)
CO2: 25 mmol/L (ref 22–32)
Calcium: 9.1 mg/dL (ref 8.9–10.3)
Chloride: 100 mmol/L (ref 98–111)
Creatinine, Ser: 0.67 mg/dL (ref 0.44–1.00)
GFR, Estimated: >60 mL/min (ref >60)
Glucose, Bld: 109 mg/dL — ABNORMAL HIGH (ref 70–99)
Potassium: 4 mmol/L (ref 3.5–5.1)
Sodium: 135 mmol/L (ref 135–145)

## 2022-07-06 LAB — CBC
HCT: 39.5 % (ref 36.0–46.0)
Hemoglobin: 13.3 g/dL (ref 12.0–15.0)
MCH: 32.8 pg (ref 26.0–34.0)
MCHC: 33.7 g/dL (ref 30.0–36.0)
MCV: 97.3 fL (ref 80.0–100.0)
Platelets: 201 10*3/uL (ref 150–400)
RBC: 4.06 MIL/uL (ref 3.87–5.11)
RDW: 12.7 % (ref 11.5–15.5)
WBC: 17.7 10*3/uL — ABNORMAL HIGH (ref 4.0–10.5)
nRBC: 0 % (ref 0.0–0.2)

## 2022-07-06 LAB — RESP PANEL BY RT-PCR (RSV, FLU A&B, COVID)  RVPGX2
Influenza A by PCR: NEGATIVE
Influenza B by PCR: NEGATIVE
Resp Syncytial Virus by PCR: NEGATIVE
SARS Coronavirus 2 by RT PCR: NEGATIVE

## 2022-07-06 LAB — TROPONIN I (HIGH SENSITIVITY): Troponin I (High Sensitivity): 6 ng/L (ref <18)

## 2022-07-06 MED ORDER — CEFUROXIME AXETIL 500 MG PO TABS: 500.0000 mg | ORAL_TABLET | Freq: Two times a day (BID) | ORAL | 0 refills | Status: AC

## 2022-07-06 MED ORDER — SODIUM CHLORIDE 0.9 % IV SOLN
1.0000 g | Freq: Once | INTRAVENOUS | Status: AC
  Administered 2022-07-06: 1 g via INTRAVENOUS
  Filled 2022-07-06: qty 10

## 2022-07-06 MED ORDER — ALBUTEROL SULFATE HFA 108 (90 BASE) MCG/ACT IN AERS: 2.0000 | INHALATION_SPRAY | Freq: Four times a day (QID) | RESPIRATORY_TRACT | 0 refills | Status: DC | PRN

## 2022-07-06 MED ORDER — PREDNISONE 20 MG PO TABS: 60.0000 mg | ORAL_TABLET | Freq: Every day | ORAL | 0 refills | Status: DC

## 2022-07-06 MED ORDER — SODIUM CHLORIDE 0.9 % IV SOLN
500.0000 mg | Freq: Once | INTRAVENOUS | Status: AC
  Administered 2022-07-06: 500 mg via INTRAVENOUS
  Filled 2022-07-06: qty 5

## 2022-07-06 MED ORDER — PREDNISONE 20 MG PO TABS: 60.0000 mg | ORAL_TABLET | Freq: Every day | ORAL | 0 refills | Status: AC

## 2022-07-06 MED ORDER — PREDNISONE 20 MG PO TABS
60.0000 mg | ORAL_TABLET | Freq: Once | ORAL | Status: AC
  Administered 2022-07-06: 60 mg via ORAL
  Filled 2022-07-06: qty 3

## 2022-07-06 MED ORDER — AZITHROMYCIN 250 MG PO TABS: ORAL_TABLET | ORAL | 0 refills | Status: DC

## 2022-07-06 MED ORDER — IPRATROPIUM-ALBUTEROL 0.5-2.5 (3) MG/3ML IN SOLN
9.0000 mL | Freq: Once | RESPIRATORY_TRACT | Status: AC
  Administered 2022-07-06: 9 mL via RESPIRATORY_TRACT
  Filled 2022-07-06: qty 3

## 2022-07-06 MED ORDER — AZITHROMYCIN 250 MG PO TABS: ORAL_TABLET | ORAL | 0 refills | Status: AC

## 2022-07-06 MED ORDER — CEFUROXIME AXETIL 500 MG PO TABS: 500.0000 mg | ORAL_TABLET | Freq: Two times a day (BID) | ORAL | 0 refills | Status: DC

## 2022-07-06 NOTE — ED Provider Notes (Signed)
[facility] Provider Note    Event Date/Time   First MD Initiated Contact with Patient 07/06/22 1735     (approximate)   History   Chief Complaint Shortness of Breath   HPI  April Sweeney is a 57 y.o. female with past medical history of hypertension, COPD, and hypothyroidism who presents to the ED complaining of shortness of breath.  Patient reports that she has been dealing with a persistent productive cough for about the past 2 months.  She states that the cough has been productive of thick yellow sputum and she was prescribed antibiotics about 2 months ago by urgent care with no relief.  She was then prescribed a course of steroids 3 weeks ago from [facility] with no relief.  She has been using her albuterol inhaler at home with partial relief.  She became increasingly short of breath today while at work and so decided to seek care in the ED.  She denies any fevers or pain in her chest, has not had any pain or swelling in her legs.  She had continued to smoke up until 2 days ago.     Physical Exam   Triage Vital Signs: ED Triage Vitals  Enc Vitals Group     BP 07/06/22 1716 (!) 137/96     Pulse Rate 07/06/22 1716 100     Resp 07/06/22 1716 20     Temp 07/06/22 1716 98.6 F (37 C)     Temp Source 07/06/22 1716 Oral     SpO2 07/06/22 1718 (!) 87 %     Weight 07/06/22 1716 78 lb (35.4 kg)     Height 07/06/22 1716 5\' 5"  (1.651 m)     Head Circumference --      Peak Flow --      Pain Score 07/06/22 1716 8     Pain Loc --      Pain Edu? --      Excl. in GC? --     Most recent vital signs: Vitals:   07/06/22 1718 07/06/22 1900  BP:  (!) 114/54  Pulse:  98  Resp:    Temp:    SpO2: (!) 87% 98%    Constitutional: Alert and oriented. Eyes: Conjunctivae are normal. Head: Atraumatic. Nose: No congestion/rhinnorhea. Mouth/Throat: Mucous membranes are moist.  Cardiovascular: Normal rate, regular rhythm. Grossly normal heart sounds.  2+ radial  pulses bilaterally. Respiratory: Normal respiratory effort.  No retractions. Lungs with expiratory wheezing throughout. Gastrointestinal: Soft and nontender. No distention. Musculoskeletal: No lower extremity tenderness nor edema.  Neurologic:  Normal speech and language. No gross focal neurologic deficits are appreciated.    ED Results / Procedures / Treatments   Labs (all labs ordered are listed, but only abnormal results are displayed) Labs Reviewed  BASIC METABOLIC PANEL - Abnormal; Notable for the following components:      Result Value   Glucose, Bld 109 (*)    All other components within normal limits  CBC - Abnormal; Notable for the following components:   WBC 17.7 (*)    All other components within normal limits  RESP PANEL BY RT-PCR (RSV, FLU A&B, COVID)  [id number]  TROPONIN I (HIGH SENSITIVITY)     EKG  ED ECG REPORT I, Chesley Noon, the attending physician, personally viewed and interpreted this ECG.   Date: 07/06/2022  EKG Time: 17:24  Rate: 98  Rhythm: normal sinus rhythm  Axis: RAD  Intervals:none  ST&T Change: Inferior T wave  inversions  RADIOLOGY Chest x-ray reviewed and interpreted by me with no infiltrate, edema, or effusion.  PROCEDURES:  Critical Care performed: Yes, see critical care procedure note(s)  .Critical Care  Performed by: Chesley Noon, MD Authorized by: Chesley Noon, MD   Critical care provider statement:    Critical care time (minutes):  30   Critical care time was exclusive of:  Separately billable procedures and treating other patients and teaching time   Critical care was necessary to treat or prevent imminent or life-threatening deterioration of the following conditions:  Respiratory failure   Critical care was time spent personally by me on the following activities:  Development of treatment plan with patient or surrogate, discussions with consultants, evaluation of patient's response to treatment, examination of patient,  ordering and review of laboratory studies, ordering and review of radiographic studies, ordering and performing treatments and interventions, pulse oximetry, re-evaluation of patient's condition and review of old charts   I assumed direction of critical care for this patient from another provider in my specialty: no      MEDICATIONS ORDERED IN ED: Medications  predniSONE (DELTASONE) tablet 60 mg (60 mg Oral Given 07/06/22 1815)  ipratropium-albuterol (DUONEB) 0.5-2.5 (3) MG/3ML nebulizer solution 9 mL (9 mLs Nebulization Given 07/06/22 1833)  cefTRIAXone (ROCEPHIN) 1 g in sodium chloride 0.9 % 100 mL IVPB (0 g Intravenous Stopped 07/06/22 2028)  azithromycin (ZITHROMAX) 500 mg in sodium chloride 0.9 % 250 mL IVPB (0 mg Intravenous Stopped 07/06/22 2028)     IMPRESSION / MDM / ASSESSMENT AND PLAN / ED COURSE  I reviewed the triage vital signs and the nursing notes.                              58 y.o. female with past medical history of hypertension, COPD, and hypothyroidism who presents to the ED complaining of increasing difficulty breathing today after dealing with active cough for the past 2 months.  Patient's presentation is most consistent with acute presentation with potential threat to life or bodily function.  Differential diagnosis includes, but is not limited to, COPD exacerbation, bronchitis, pneumonia, COVID-19, influenza, ACS, PE.  Patient nontoxic-appearing and in no acute distress, vital signs were remarkable for hypoxia with oxygen saturation of 87% on room air in triage.  Patient placed on 2 L nasal cannula in triage with improvement, now at 95% with no respiratory distress.  She does have significant wheezing on exam and suspect COPD exacerbation, will treat with DuoNebs and steroids.  EKG shows inferior T wave inversions similar to previous and low suspicion for ACS or PE at this time.  Labs remarkable for leukocytosis, but no significant anemia, electrolyte abnormality, or AKI  noted.  Troponin within normal limits.  Patient feeling better following DuoNeb's and steroids, however chest x-ray appears concerning for developing pneumonia.  Patient given dose of IV Rocephin and azithromycin, labs show leukocytosis consistent with pneumonia.  No anemia, electrolyte abnormality, or AKI noted and troponin is within normal limits.  Patient was weaned to room air with oxygen saturations of 91 to 92%, however I spoke with her on multiple occasions about being admitted to the hospital due to pneumonia and significant COPD exacerbation.  Patient repeatedly declines and wishes to be discharged home.  We will prescribe cefuroxime and azithromycin along with course of steroids and refill of albuterol.  She was counseled to return to the ED for new or worsening symptoms, patient agrees  with plan.      FINAL CLINICAL IMPRESSION(S) / ED DIAGNOSES   Final diagnoses:  Pneumonia of right lower lobe due to infectious organism  COPD exacerbation (HCC)     Rx / DC Orders   ED Discharge Orders          Ordered    predniSONE (DELTASONE) 20 MG tablet  Daily with breakfast        07/06/22 2108    cefUROXime (CEFTIN) 500 MG tablet  2 times daily with meals        07/06/22 2108    azithromycin (ZITHROMAX Z-PAK) 250 MG tablet        07/06/22 2108    albuterol (VENTOLIN HFA) 108 (90 Base) MCG/ACT inhaler  Every 6 hours PRN       Note to Pharmacy: Please supply with spacer   07/06/22 2108             Note:  This document was prepared using Dragon voice recognition software and may include unintentional dictation errors.   Chesley Noon, MD 07/06/22 2109

## 2022-07-06 NOTE — ED Triage Notes (Signed)
Pt reports sob and chest tightness.  Pt reports a cough since February .  Hx copd   pt reports upper back pain.  Sx for 4 days Pt alert  speech clear.

## 2022-09-10 ENCOUNTER — Encounter: Payer: Self-pay | Admitting: Emergency Medicine

## 2022-09-10 ENCOUNTER — Emergency Department: Payer: Self-pay

## 2022-09-10 ENCOUNTER — Other Ambulatory Visit: Payer: Self-pay

## 2022-09-10 ENCOUNTER — Inpatient Hospital Stay
Admission: EM | Admit: 2022-09-10 | Discharge: 2022-09-11 | DRG: 190 | Disposition: A | Discharge status: Home / Self Care | Payer: Self-pay | Attending: Obstetrics and Gynecology | Admitting: Obstetrics and Gynecology

## 2022-09-10 DIAGNOSIS — Z9981 Dependence on supplemental oxygen: Secondary | ICD-10-CM

## 2022-09-10 DIAGNOSIS — Z800 Family history of malignant neoplasm of digestive organs: Secondary | ICD-10-CM

## 2022-09-10 DIAGNOSIS — J9621 Acute and chronic respiratory failure with hypoxia: Secondary | ICD-10-CM

## 2022-09-10 DIAGNOSIS — F17210 Nicotine dependence, cigarettes, uncomplicated: Secondary | ICD-10-CM | POA: Diagnosis present

## 2022-09-10 DIAGNOSIS — R000 Tachycardia, unspecified: Secondary | ICD-10-CM | POA: Diagnosis present

## 2022-09-10 DIAGNOSIS — J9601 Acute respiratory failure with hypoxia: Secondary | ICD-10-CM | POA: Diagnosis present

## 2022-09-10 DIAGNOSIS — Z79899 Other long term (current) drug therapy: Secondary | ICD-10-CM

## 2022-09-10 DIAGNOSIS — Z681 Body mass index (BMI) 19 or less, adult: Secondary | ICD-10-CM

## 2022-09-10 DIAGNOSIS — Z7951 Long term (current) use of inhaled steroids: Secondary | ICD-10-CM

## 2022-09-10 DIAGNOSIS — Z1152 Encounter for screening for COVID-19: Secondary | ICD-10-CM

## 2022-09-10 DIAGNOSIS — Z885 Allergy status to narcotic agent status: Secondary | ICD-10-CM

## 2022-09-10 DIAGNOSIS — J441 Chronic obstructive pulmonary disease with (acute) exacerbation: Principal | ICD-10-CM | POA: Diagnosis present

## 2022-09-10 DIAGNOSIS — J439 Emphysema, unspecified: Secondary | ICD-10-CM | POA: Diagnosis present

## 2022-09-10 DIAGNOSIS — Z79890 Hormone replacement therapy (postmenopausal): Secondary | ICD-10-CM

## 2022-09-10 DIAGNOSIS — R6510 Systemic inflammatory response syndrome (SIRS) of non-infectious origin without acute organ dysfunction: Secondary | ICD-10-CM | POA: Diagnosis present

## 2022-09-10 DIAGNOSIS — R0902 Hypoxemia: Secondary | ICD-10-CM

## 2022-09-10 DIAGNOSIS — R64 Cachexia: Secondary | ICD-10-CM | POA: Diagnosis present

## 2022-09-10 DIAGNOSIS — I10 Essential (primary) hypertension: Secondary | ICD-10-CM | POA: Diagnosis present

## 2022-09-10 DIAGNOSIS — Z720 Tobacco use: Secondary | ICD-10-CM

## 2022-09-10 DIAGNOSIS — E039 Hypothyroidism, unspecified: Secondary | ICD-10-CM | POA: Diagnosis present

## 2022-09-10 LAB — BASIC METABOLIC PANEL
Anion gap: 9 (ref 5–15)
BUN: 9 mg/dL (ref 6–20)
CO2: 27 mmol/L (ref 22–32)
Calcium: 9.2 mg/dL (ref 8.9–10.3)
Chloride: 100 mmol/L (ref 98–111)
Creatinine, Ser: 0.54 mg/dL (ref 0.44–1.00)
GFR, Estimated: >60 mL/min (ref >60)
Glucose, Bld: 102 mg/dL — ABNORMAL HIGH (ref 70–99)
Potassium: 3.7 mmol/L (ref 3.5–5.1)
Sodium: 136 mmol/L (ref 135–145)

## 2022-09-10 LAB — CBC
HCT: 43.9 % (ref 36.0–46.0)
Hemoglobin: 14.5 g/dL (ref 12.0–15.0)
MCH: 32.8 pg (ref 26.0–34.0)
MCHC: 33 g/dL (ref 30.0–36.0)
MCV: 99.3 fL (ref 80.0–100.0)
Platelets: 187 10*3/uL (ref 150–400)
RBC: 4.42 MIL/uL (ref 3.87–5.11)
RDW: 12.7 % (ref 11.5–15.5)
WBC: 6.6 10*3/uL (ref 4.0–10.5)
nRBC: 0 % (ref 0.0–0.2)

## 2022-09-10 LAB — TROPONIN I (HIGH SENSITIVITY): Troponin I (High Sensitivity): 5 ng/L (ref <18)

## 2022-09-10 MED ORDER — METHYLPREDNISOLONE SODIUM SUCC 125 MG IJ SOLR
125.0000 mg | Freq: Once | INTRAMUSCULAR | Status: AC
  Administered 2022-09-11: 125 mg via INTRAVENOUS
  Filled 2022-09-10: qty 2

## 2022-09-10 MED ORDER — IPRATROPIUM-ALBUTEROL 0.5-2.5 (3) MG/3ML IN SOLN
9.0000 mL | Freq: Once | RESPIRATORY_TRACT | Status: AC
  Administered 2022-09-11: 9 mL via RESPIRATORY_TRACT
  Filled 2022-09-10: qty 9

## 2022-09-10 MED ORDER — SODIUM CHLORIDE 0.9 % IV SOLN
1.0000 g | Freq: Once | INTRAVENOUS | Status: AC
  Administered 2022-09-11: 1 g via INTRAVENOUS
  Filled 2022-09-10: qty 10

## 2022-09-10 NOTE — ED Provider Notes (Incomplete)
[facility] Provider Note    Event Date/Time   First MD Initiated Contact with Patient 09/10/22 2330     (approximate)   History   Shortness of Breath   HPI April Sweeney is a 57 y.o. female past medical history significant for COPD, who presents to the emergency department with shortness of breath.  Patient states that she has had worsening shortness of breath today.  Checked her oxygen at home and it was 88% so she came to the emergency department.  Endorses ongoing productive cough since she had pneumonia in May and feels like it has worsened over the past couple of days.  Denies any chest pain but states that she has some back pain from her shortness of breath.  No history of DVT or PE.  Ongoing tobacco use.  No recent trip or travel.  No nausea, vomiting or diaphoresis.  No fever or chills.  States that she has home inhalers and inhaled steroids.  Not followed by pulmonology.  Prior workup with PET scans and CT scans for questionable pulmonary nodules.     Physical Exam   Triage Vital Signs: ED Triage Vitals  Enc Vitals Group     BP 09/10/22 2229 (!) 165/93     Pulse Rate 09/10/22 2229 91     Resp 09/10/22 2229 (!) 22     Temp 09/10/22 2229 98 F (36.7 C)     Temp Source 09/10/22 2229 Oral     SpO2 09/10/22 2229 95 %     Weight 09/10/22 2228 87 lb (39.5 kg)     Height 09/10/22 2228 5\' 5"  (1.651 m)     Head Circumference --      Peak Flow --      Pain Score --      Pain Loc --      Pain Edu? --      Excl. in GC? --     Most recent vital signs: Vitals:   09/11/22 0042 09/11/22 0045  BP:    Pulse: (S) (!) 122 (!) 115  Resp:  (!) 23  Temp:    SpO2: (S) (!) 88% 93%    Physical Exam Constitutional:      Comments: Thin female  HENT:     Head: Atraumatic.  Eyes:     Conjunctiva/sclera: Conjunctivae normal.  Cardiovascular:     Rate and Rhythm: Regular rhythm.  Pulmonary:     Effort: Tachypnea and respiratory distress present.      Breath sounds: Decreased breath sounds and wheezing present.  Abdominal:     General: There is no distension.     Palpations: Abdomen is soft.     Tenderness: There is no abdominal tenderness.  Musculoskeletal:        General: Normal range of motion.     Cervical back: Normal range of motion.     Right lower leg: No edema.     Left lower leg: No edema.     Comments: No unilateral leg swelling  Skin:    General: Skin is warm.     Capillary Refill: Capillary refill takes less than 2 seconds.  Neurological:     General: No focal deficit present.     Mental Status: She is alert. Mental status is at baseline.     IMPRESSION / MDM / ASSESSMENT AND PLAN / ED COURSE  I reviewed the triage vital signs and the nursing notes.  On chart review patient appeared to have  a PET scan in 2022, do not see any recent CT imaging  Differential diagnosis including COPD exacerbation, pneumonia, viral illness, new onset CHF, ACS, anemia, malignancy  EKG  I, Corena Herter, the attending physician, personally viewed and interpreted this ECG.   Rate: Normal  Rhythm: Normal sinus  Axis: Normal  Intervals: Normal  ST&T Change: ST depression to the lateral leads.  Significant artifact on EKG  No tachycardic or bradycardic dysrhythmias while on cardiac telemetry.  RADIOLOGY I independently reviewed imaging, my interpretation of imaging: Chest x-ray with findings of severe emphysematous lung disease.  Several small ill-defined focal opacities concerning for scarring versus pulmonary nodules.  Recommended follow-up CT scan.  LABS (all labs ordered are listed, but only abnormal results are displayed) Labs interpreted as -    Labs Reviewed  BASIC METABOLIC PANEL - Abnormal; Notable for the following components:      Result Value   Glucose, Bld 102 (*)    All other components within normal limits  SARS CORONAVIRUS 2 BY RT PCR  CULTURE, BLOOD (ROUTINE X 2)  CULTURE, BLOOD (ROUTINE X 2)  CBC  LACTIC  ACID, PLASMA  TROPONIN I (HIGH SENSITIVITY)     MDM  On arrival initial oxygen saturation was 95%.  Patient does have increased work of breathing with poor air movement and wheezing on exam.  No focal rhonchi or rales.  Low suspicion for pulmonary embolism, low risk Wells criteria, clinical picture appears more consistent with a COPD exacerbation.  Low suspicion for ACS, no active chest pain at this time, initial troponin is negative.  No findings anemia.  Will treat the patient for COPD exacerbation with continuous DuoNebs, IV Solu-Medrol and IV Rocephin  On reevaluation continues to have increased work of breathing.  Tachycardic most likely secondary to DuoNeb treatments but will add on blood cultures and lactic acid.  Felt it could be detrimental to delay antibiotics for blood cultures given her severe shortness of breath.     Clinical Course as of 09/11/22 0133  Mon Sep 11, 2022  [id number] Patient hypoxic with minimal ambulation to 88%.  Feels worsening shortness of breath when this occurs.  Patient with ongoing increased work of breathing following DuoNeb treatment and IV steroids.  Will consult the hospitalist for admission for acute hypoxic respiratory failure in the setting of COPD exacerbation. [SM]    Clinical Course User Index [SM] Corena Herter, MD     PROCEDURES:  Critical Care performed: yes  .Critical Care  Performed by: Corena Herter, MD Authorized by: Corena Herter, MD   Critical care provider statement:    Critical care time (minutes):  30   Critical care time was exclusive of:  Separately billable procedures and treating other patients   Critical care was necessary to treat or prevent imminent or life-threatening deterioration of the following conditions:  Respiratory failure   Critical care was time spent personally by me on the following activities:  Development of treatment plan with patient or surrogate, discussions with consultants, evaluation of patient's  response to treatment, examination of patient, ordering and review of laboratory studies, ordering and review of radiographic studies, ordering and performing treatments and interventions, pulse oximetry, re-evaluation of patient's condition and review of old charts   Patient's presentation is most consistent with acute presentation with potential threat to life or bodily function.   MEDICATIONS ORDERED IN ED: Medications  ipratropium-albuterol (DUONEB) 0.5-2.5 (3) MG/3ML nebulizer solution 9 mL (9 mLs Nebulization Given 09/11/22 0015)  methylPREDNISolone sodium succinate (  SOLU-MEDROL) 125 mg/2 mL injection 125 mg (125 mg Intravenous Given 09/11/22 0017)  cefTRIAXone (ROCEPHIN) 1 g in sodium chloride 0.9 % 100 mL IVPB (0 g Intravenous Stopped 09/11/22 0044)    FINAL CLINICAL IMPRESSION(S) / ED DIAGNOSES   Final diagnoses:  COPD exacerbation (HCC)  Hypoxia  Tobacco use     Rx / DC Orders   ED Discharge Orders     None        Note:  This document was prepared using Dragon voice recognition software and may include unintentional dictation errors.   Corena Herter, MD 09/11/22 1610    Corena Herter, MD 09/11/22 [phone]

## 2022-09-10 NOTE — ED Triage Notes (Signed)
Pt presents ambulatory to triage via POV with complaints of SOB x 1 day. Hx of COPD - no O2 requirements. She notes having some low O2 readings a home of 89-90% on RA. Pt endorses a productive cough x 2 months since she had PNA. A&Ox4 at this time. Denies CP or fevers.

## 2022-09-11 ENCOUNTER — Other Ambulatory Visit: Payer: Self-pay

## 2022-09-11 ENCOUNTER — Encounter: Payer: Self-pay | Admitting: Internal Medicine

## 2022-09-11 ENCOUNTER — Other Ambulatory Visit: Payer: Self-pay | Admitting: Obstetrics and Gynecology

## 2022-09-11 DIAGNOSIS — R6510 Systemic inflammatory response syndrome (SIRS) of non-infectious origin without acute organ dysfunction: Secondary | ICD-10-CM | POA: Insufficient documentation

## 2022-09-11 DIAGNOSIS — J9621 Acute and chronic respiratory failure with hypoxia: Secondary | ICD-10-CM

## 2022-09-11 DIAGNOSIS — J441 Chronic obstructive pulmonary disease with (acute) exacerbation: Secondary | ICD-10-CM

## 2022-09-11 DIAGNOSIS — J9601 Acute respiratory failure with hypoxia: Secondary | ICD-10-CM

## 2022-09-11 LAB — LACTIC ACID, PLASMA: Lactic Acid, Venous: 0.9 mmol/L (ref 0.5–1.9)

## 2022-09-11 LAB — HIV ANTIBODY (ROUTINE TESTING W REFLEX): HIV Screen 4th Generation wRfx: NONREACTIVE

## 2022-09-11 LAB — SARS CORONAVIRUS 2 BY RT PCR: SARS Coronavirus 2 by RT PCR: NEGATIVE

## 2022-09-11 MED ORDER — LEVOTHYROXINE SODIUM 88 MCG PO TABS
88.0000 ug | ORAL_TABLET | Freq: Every day | ORAL | Status: DC
  Filled 2022-09-11: qty 1

## 2022-09-11 MED ORDER — ENOXAPARIN SODIUM 40 MG/0.4ML IJ SOSY: 40.0000 mg | PREFILLED_SYRINGE | INTRAMUSCULAR | Status: DC

## 2022-09-11 MED ORDER — ONDANSETRON HCL 4 MG PO TABS: 4.0000 mg | ORAL_TABLET | Freq: Four times a day (QID) | ORAL | Status: DC | PRN

## 2022-09-11 MED ORDER — LISINOPRIL 10 MG PO TABS: 10.0000 mg | ORAL_TABLET | Freq: Every day | ORAL | Status: DC

## 2022-09-11 MED ORDER — ALBUTEROL SULFATE HFA 108 (90 BASE) MCG/ACT IN AERS: 2.0000 | INHALATION_SPRAY | Freq: Four times a day (QID) | RESPIRATORY_TRACT | 0 refills | Status: AC | PRN

## 2022-09-11 MED ORDER — HYDROCODONE-ACETAMINOPHEN 5-325 MG PO TABS: 1.0000 | ORAL_TABLET | ORAL | Status: DC | PRN

## 2022-09-11 MED ORDER — METHYLPREDNISOLONE SODIUM SUCC 40 MG IJ SOLR: 40.0000 mg | Freq: Two times a day (BID) | INTRAMUSCULAR | Status: DC

## 2022-09-11 MED ORDER — PREDNISONE 10 MG PO TABS: 40.0000 mg | ORAL_TABLET | Freq: Every day | ORAL | 0 refills | Status: AC

## 2022-09-11 MED ORDER — ACETAMINOPHEN 325 MG PO TABS: 650.0000 mg | ORAL_TABLET | Freq: Four times a day (QID) | ORAL | Status: DC | PRN

## 2022-09-11 MED ORDER — ALBUTEROL SULFATE (2.5 MG/3ML) 0.083% IN NEBU: 2.5000 mg | INHALATION_SOLUTION | RESPIRATORY_TRACT | Status: DC | PRN

## 2022-09-11 MED ORDER — CEFDINIR 300 MG PO CAPS: 300.0000 mg | ORAL_CAPSULE | Freq: Two times a day (BID) | ORAL | 0 refills | Status: AC

## 2022-09-11 MED ORDER — PREDNISONE 20 MG PO TABS: 40.0000 mg | ORAL_TABLET | Freq: Every day | ORAL | Status: DC

## 2022-09-11 MED ORDER — ACETAMINOPHEN 650 MG RE SUPP: 650.0000 mg | Freq: Four times a day (QID) | RECTAL | Status: DC | PRN

## 2022-09-11 MED ORDER — ONDANSETRON HCL 4 MG/2ML IJ SOLN: 4.0000 mg | Freq: Four times a day (QID) | INTRAMUSCULAR | Status: DC | PRN

## 2022-09-11 MED ORDER — IPRATROPIUM-ALBUTEROL 0.5-2.5 (3) MG/3ML IN SOLN
3.0000 mL | Freq: Four times a day (QID) | RESPIRATORY_TRACT | Status: DC
  Administered 2022-09-11: 3 mL via RESPIRATORY_TRACT
  Filled 2022-09-11: qty 3

## 2022-09-11 MED ORDER — GUAIFENESIN ER 600 MG PO TB12
600.0000 mg | ORAL_TABLET | Freq: Two times a day (BID) | ORAL | Status: DC
  Administered 2022-09-11: 600 mg via ORAL
  Filled 2022-09-11: qty 1

## 2022-09-11 NOTE — Assessment & Plan Note (Signed)
Had currently on any meds BP 165/93.  Will start lisinopril

## 2022-09-11 NOTE — Assessment & Plan Note (Signed)
Acute respiratory failure with hypoxia Scheduled and as needed nebulized bronchodilators IV steroids Flutter valve, antitussives Supplemental oxygen and wean as tolerated (patient was discharged on O2 back in 2022 so home O2 eval might be appropriate)

## 2022-09-11 NOTE — Assessment & Plan Note (Signed)
Continue levothyroxine 

## 2022-09-11 NOTE — H&P (Signed)
History and Physical    Patient: April Sweeney ZOX:[id number] DOB: [birth year] DOA: 09/10/2022 DOS: the patient was seen and examined on 09/11/2022 [facility]CP[facility]  Patient coming from: Home  Chief Complaint:  Chief Complaint  Patient presents with   Shortness of Breath    HPI: April Sweeney is a 57 y.o. female with medical history significant for COPD who presents to the emergency room with wheezing that worsened during the day with O2 sat on home pulse ox of 88%.  Patient was last hospitalized for COPD in 2022 and review of discharge summary reveals that she was discharged on home O2 at 2 L but no longer uses oxygen.  She complains of cough that has been persistent for the past few days.  Denies chest pain.  Denies fever or chills. ED course and data review: Tachycardic to the 1 teens and tachypneic to the low 20s with O2 sat as low as 88% with ambulation.  Afebrile and BP 165/93.  Labs unremarkable with normal WBC and lactic acid.  Troponin of 5 EKG, Personally viewed and interpreted shows sinus at 91 with nonspecific ST-T wave changes. Chest x-ray showed marked severity emphysematous lung disease and several small ill-defined focal opacities which may represent areas of scarring and/or atelectasis as follows: MPRESSION: 1. Marked severity emphysematous lung disease. 2. Several small, ill-defined focal opacities which may represent areas of scarring and/or atelectasis. Correlation with follow-up chest CT is recommended to exclude the presence of underlying pulmonary nodules.  (Of note patient had an abnormal CT during her hospitalization in 2022 and was referred for a PET scan for follow-up which was negative) Patient treated with DuoNebs, methylprednisolone and started on ceftriaxone. Hospitalist consulted for admission.   Review of Systems: As mentioned in the history of present illness. All other systems reviewed and are negative.  Past Medical History:   Diagnosis Date   COPD (chronic obstructive pulmonary disease) (HCC)    Influenza A 02/13/2021   Thyroid disease    Wears dentures    full upper, partial lower   Past Surgical History:  Procedure Laterality Date   BREAST CYST ASPIRATION Left 1992   CESAREAN SECTION     COLONOSCOPY WITH PROPOFOL N/A 05/16/2022   Procedure: COLONOSCOPY WITH PROPOFOL;  Surgeon: Toney Reil, MD;  Location: [facility];  Service: Endoscopy;  Laterality: N/A;   POLYPECTOMY  05/16/2022   Procedure: POLYPECTOMY;  Surgeon: Toney Reil, MD;  Location: [facility];  Service: Endoscopy;;   WRIST GANGLION EXCISION     Social History:  reports that she has been smoking cigarettes. She has a 10.00 pack-year smoking history. She has never used smokeless tobacco. She reports that she does not drink alcohol and does not use drugs.  Allergies  Allergen Reactions   Percocet [Oxycodone-Acetaminophen] Hives    Family History  Problem Relation Age of Onset   Dementia Mother    Cancer Father    Liver cancer Father    Breast cancer Neg Hx     Prior to Admission medications   Medication Sig Start Date End Date Taking? Authorizing Provider  ADVAIR DISKUS 500-50 MCG/ACT AEPB Inhale 1 puff into the lungs 2 (two) times daily. 05/30/22   [provider]  albuterol (VENTOLIN HFA) 108 (90 Base) MCG/ACT inhaler Inhale 2 puffs into the lungs every 6 (six) hours as needed for wheezing or shortness of breath. 07/06/22   Chesley Noon, MD  Cholecalciferol (VITAMIN D3 PO) Take 1 tablet  by mouth every morning. With K2    [provider]  Cyanocobalamin (VITAMIN B-12 PO) Take 1 tablet by mouth every morning.    [provider]  fluticasone (FLONASE) 50 MCG/ACT nasal spray Place into both nostrils daily.    [provider]  levothyroxine (SYNTHROID) 88 MCG tablet Take 88 mcg by mouth daily before breakfast.    [provider]  Multiple Vitamins-Minerals (ZINC  PO) Take 1 tablet by mouth at bedtime.    [provider]  Spacer/Aero-Holding Chambers (AEROCHAMBER PLUS) inhaler Use with inhaler 05/19/20   Domenick Gong, MD  tiotropium (SPIRIVA) 18 MCG inhalation capsule Place 18 mcg into inhaler and inhale every morning.    [provider]    Physical Exam: Vitals:   09/11/22 0038 09/11/22 0039 09/11/22 0042 09/11/22 0045  BP:      Pulse: (!) 110 (!) 115 (S) (!) 122 (!) 115  Resp: (!) 25 (!) 21  (!) 23  Temp:      TempSrc:      SpO2: 99% 100% (S) (!) 88% 93%  Weight:      Height:       Physical Exam Vitals and nursing note reviewed.  Constitutional:      General: She is not in acute distress.    Appearance: She is cachectic.  HENT:     Head: Normocephalic and atraumatic.  Cardiovascular:     Rate and Rhythm: Normal rate and regular rhythm.     Heart sounds: Normal heart sounds.  Pulmonary:     Effort: Tachypnea present.     Breath sounds: Wheezing and rhonchi present.  Abdominal:     Palpations: Abdomen is soft.     Tenderness: There is no abdominal tenderness.  Neurological:     Mental Status: Mental status is at baseline.     Labs on Admission: I have personally reviewed following labs and imaging studies  CBC: Recent Labs  Lab 09/10/22 2234  WBC 6.6  HGB 14.5  HCT 43.9  MCV 99.3  PLT 187   Basic Metabolic Panel: Recent Labs  Lab 09/10/22 2234  NA 136  K 3.7  CL 100  CO2 27  GLUCOSE 102*  BUN 9  CREATININE 0.54  CALCIUM 9.2   GFR: Estimated Creatinine Clearance: 49 mL/min (by C-G formula based on SCr of 0.54 mg/dL). Liver Function Tests: No results for input(s): "AST", "ALT", "ALKPHOS", "BILITOT", "PROT", "ALBUMIN" in the last 168 hours. No results for input(s): "LIPASE", "AMYLASE" in the last 168 hours. No results for input(s): "AMMONIA" in the last 168 hours. Coagulation Profile: No results for input(s): "INR", "PROTIME" in the last 168 hours. Cardiac Enzymes: No results for  input(s): "CKTOTAL", "CKMB", "CKMBINDEX", "TROPONINI" in the last 168 hours. BNP (last 3 results) No results for input(s): "PROBNP" in the last 8760 hours. HbA1C: No results for input(s): "HGBA1C" in the last 72 hours. CBG: No results for input(s): "GLUCAP" in the last 168 hours. Lipid Profile: No results for input(s): "CHOL", "HDL", "LDLCALC", "TRIG", "CHOLHDL", "LDLDIRECT" in the last 72 hours. Thyroid Function Tests: No results for input(s): "TSH", "T4TOTAL", "FREET4", "T3FREE", "THYROIDAB" in the last 72 hours. Anemia Panel: No results for input(s): "VITAMINB12", "FOLATE", "FERRITIN", "TIBC", "IRON", "RETICCTPCT" in the last 72 hours. Urine analysis:    Component Value Date/Time   COLORURINE YELLOW 04/29/2015 1028   APPEARANCEUR CLEAR 04/29/2015 1028   LABSPEC <1.005 (L) 04/29/2015 1028   PHURINE 6.0 04/29/2015 1028   GLUCOSEU NEGATIVE 04/29/2015 1028  HGBUR NEGATIVE 04/29/2015 1028   BILIRUBINUR NEGATIVE 04/29/2015 1028   KETONESUR NEGATIVE 04/29/2015 1028   PROTEINUR NEGATIVE 04/29/2015 1028   NITRITE NEGATIVE 04/29/2015 1028   LEUKOCYTESUR NEGATIVE 04/29/2015 1028    Radiological Exams on Admission: DG Chest 2 View  Result Date: 09/10/2022 CLINICAL DATA:  Shortness of breath. EXAM: CHEST - 2 VIEW COMPARISON:  Jul 06, 2022 FINDINGS: The heart size and mediastinal contours are within normal limits. The lungs are hyperinflated with evidence of marked severity emphysematous lung disease. Several small, ill-defined areas of focal opacification are seen bilaterally. There is no evidence of acute infiltrate, pleural effusion or pneumothorax. The visualized skeletal structures are unremarkable. IMPRESSION: 1. Marked severity emphysematous lung disease. 2. Several small, ill-defined focal opacities which may represent areas of scarring and/or atelectasis. Correlation with follow-up chest CT is recommended to exclude the presence of underlying pulmonary nodules. Electronically Signed    By: Aram Candela M.D.   On: 09/10/2022 22:58     Data Reviewed: Relevant notes from primary care and specialist visits, past discharge summaries as available in EHR, including Care Everywhere. Prior diagnostic testing as pertinent to current admission diagnoses Updated medications and problem lists for reconciliation ED course, including vitals, labs, imaging, treatment and response to treatment Triage notes, nursing and pharmacy notes and ED provider's notes Notable results as noted in HPI   Assessment and Plan: * COPD with acute exacerbation (HCC) Acute respiratory failure with hypoxia Scheduled and as needed nebulized bronchodilators IV steroids Flutter valve, antitussives Supplemental oxygen and wean as tolerated (patient was discharged on O2 back in 2022 so home O2 eval might be appropriate)  SIRS (systemic inflammatory response syndrome) (HCC) Patient tachycardic and tachypneic, likely related to COPD on neb treatments.  Sepsis not suspected given normal WBC and lactic acid  HTN (hypertension) Had currently on any meds BP 165/93.  Will start lisinopril  Hypothyroid Continue levothyroxine     DVT prophylaxis: Lovenox  Consults: none  Advance Care Planning:   Code Status: Prior   Family Communication: none  Disposition Plan: Back to previous home environment  Severity of Illness: The appropriate patient status for this patient is INPATIENT. Inpatient status is judged to be reasonable and necessary in order to provide the required intensity of service to ensure the patient's safety. The patient's presenting symptoms, physical exam findings, and initial radiographic and laboratory data in the context of their chronic comorbidities is felt to place them at high risk for further clinical deterioration. Furthermore, it is not anticipated that the patient will be medically stable for discharge from the hospital within 2 midnights of admission.   * I certify that at  the point of admission it is my clinical judgment that the patient will require inpatient hospital care spanning beyond 2 midnights from the point of admission due to high intensity of service, high risk for further deterioration and high frequency of surveillance required.*  Author: Andris Baumann, MD 09/11/2022 2:14 AM  For on call review [email or web address].

## 2022-09-11 NOTE — Discharge Summary (Signed)
April Sweeney:[id number] DOB: 1965-05-29 DOA: 09/10/2022  PCP: [facility]  Admit date: 09/10/2022 Discharge date: 09/11/2022  Time spent: 35 minutes  Recommendations for Outpatient Follow-up:  Pcp f/u     Discharge Diagnoses:  Principal Problem:   COPD with acute exacerbation [facility]) Active Problems:   Acute respiratory failure with hypoxia (HCC)   Hypothyroid   HTN (hypertension)   SIRS (systemic inflammatory response syndrome) (HCC)   Discharge Condition: improved  Diet recommendation: heart healthy  Filed Weights   09/10/22 2228  Weight: 39.5 kg    History of present illness:  From admission h and p April Sweeney is a 57 y.o. female with medical history significant for COPD who presents to the emergency room with wheezing that worsened during the day with O2 sat on home pulse ox of 88%.  Patient was last hospitalized for COPD in 2022 and review of discharge summary reveals that she was discharged on home O2 at 2 L but no longer uses oxygen.  She complains of cough that has been persistent for the past few days.  Denies chest pain.  Denies fever or chills.   Hospital Course:  Patient presents with one day of cough, dyspnea, and report of home o2 of 88%. CXR with emphysematous changes, nothing focal. No fever to suggest pneumonia. Covid negative. She was admitted for copd exacerbation and started on steroids. A few hours later she was examined and found to be satting normally, breathing comfortably. She ambulated well and had normal o2 level while ambulating. This appears to be a relatively mild copd exacerbation and so inpatient stay not warranted. Will discharge to complete a 5 day course of prednisone and cefdinir with scheduled albuterol. Smoking cessation advised.   Procedures: none   Consultations: none  Discharge Exam: Vitals:   09/11/22 0304 09/11/22 0814  BP: 112/79 114/69  Pulse: (!) 102 95  Resp: 20 14  Temp:  98.3 F (36.8 C)  SpO2: 98%  95%    General: NAD Cardiovascular: RRR Respiratory: Few scattered faint rhonchi, few faint ext wheeze Ext: warm, no edema  Discharge Instructions   Discharge Instructions     Diet - low sodium heart healthy   Complete by: As directed    Increase activity slowly   Complete by: As directed       Allergies as of 09/11/2022       Reactions   Percocet [oxycodone-acetaminophen] Hives        Medication List     TAKE these medications    Advair Diskus 500-50 MCG/ACT Aepb Generic drug: fluticasone-salmeterol Inhale 1 puff into the lungs 2 (two) times daily.   AeroChamber Plus inhaler Use with inhaler   albuterol 108 (90 Base) MCG/ACT inhaler Commonly known as: VENTOLIN HFA Inhale 2 puffs into the lungs every 6 (six) hours as needed for wheezing or shortness of breath.   cefdinir 300 MG capsule Commonly known as: OMNICEF Take 1 capsule (300 mg total) by mouth 2 (two) times daily for 5 days.   fluticasone 50 MCG/ACT nasal spray Commonly known as: FLONASE Place 1 spray into both nostrils daily as needed.   levothyroxine 88 MCG tablet Commonly known as: SYNTHROID Take 88 mcg by mouth daily before breakfast.   predniSONE 10 MG tablet Commonly known as: DELTASONE Take 4 tablets (40 mg total) by mouth daily for 5 days.   tiotropium 18 MCG inhalation capsule Commonly known as: SPIRIVA Place 18 mcg into inhaler and inhale every morning.  VITAMIN K2-VITAMIN D3 PO Take 1 capsule by mouth daily.   ZINC PO Take 1 tablet by mouth at bedtime.       Allergies  Allergen Reactions   Percocet [Oxycodone-Acetaminophen] Hives    Follow-up Information     [facility] Schedule an appointment as soon as possible for a visit in 1 day.   Contact information: [address] [place] [address] [phone]                  The results of significant diagnostics from this hospitalization (including imaging, microbiology, ancillary and  laboratory) are listed below for reference.    Significant Diagnostic Studies: DG Chest 2 View  Result Date: 09/10/2022 CLINICAL DATA:  Shortness of breath. EXAM: CHEST - 2 VIEW COMPARISON:  Jul 06, 2022 FINDINGS: The heart size and mediastinal contours are within normal limits. The lungs are hyperinflated with evidence of marked severity emphysematous lung disease. Several small, ill-defined areas of focal opacification are seen bilaterally. There is no evidence of acute infiltrate, pleural effusion or pneumothorax. The visualized skeletal structures are unremarkable. IMPRESSION: 1. Marked severity emphysematous lung disease. 2. Several small, ill-defined focal opacities which may represent areas of scarring and/or atelectasis. Correlation with follow-up chest CT is recommended to exclude the presence of underlying pulmonary nodules. Electronically Signed   By: Aram Candela M.D.   On: 09/10/2022 22:58    Microbiology: Recent Results (from the past 240 hour(s))  SARS Coronavirus 2 by RT PCR (hospital order, performed in [facility] hospital lab) *cepheid single result test* Anterior Nasal Swab     Status: None   Collection Time: 09/11/22 12:52 AM   Specimen: Anterior Nasal Swab  Result Value Ref Range Status   SARS Coronavirus 2 by RT PCR NEGATIVE NEGATIVE Final    Comment: (NOTE) SARS-CoV-2 target nucleic acids are NOT DETECTED.  The SARS-CoV-2 RNA is generally detectable in upper and lower respiratory specimens during the acute phase of infection. The lowest concentration of SARS-CoV-2 viral copies this assay can detect is 250 copies / mL. A negative result does not preclude SARS-CoV-2 infection and should not be used as the sole basis for treatment or other patient management decisions.  A negative result may occur with improper specimen collection / handling, submission of specimen other than nasopharyngeal swab, presence of viral mutation(s) within the areas targeted by this assay,  and inadequate number of viral copies (<250 copies / mL). A negative result must be combined with clinical observations, patient history, and epidemiological information.  Fact Sheet for Patients:   RoadLapTop.co.za  Fact Sheet for Healthcare Providers: http://kim-miller.com/  This test is not yet approved or  cleared by the [place] FDA and has been authorized for detection and/or diagnosis of SARS-CoV-2 by FDA under an Emergency Use Authorization (EUA).  This EUA will remain in effect (meaning this test can be used) for the duration of the COVID-19 declaration under Section 564(b)(1) of the Act, 21 U.S.C. section 360bbb-3(b)(1), unless the authorization is terminated or revoked sooner.  Performed at [facility], [address] [place], [place] [address]   Blood culture (routine x 2)     Status: None (Preliminary result)   Collection Time: 09/11/22 12:52 AM   Specimen: BLOOD RIGHT HAND  Result Value Ref Range Status   Specimen Description BLOOD RIGHT HAND  Final   Special Requests   Final    BOTTLES DRAWN AEROBIC AND ANAEROBIC Blood Culture adequate volume   Culture  Final    NO GROWTH < 12 HOURS Performed at [facility], [address] [place], [place] [address]    Report Status PENDING  Incomplete  Blood culture (routine x 2)     Status: None (Preliminary result)   Collection Time: 09/11/22 12:52 AM   Specimen: BLOOD LEFT HAND  Result Value Ref Range Status   Specimen Description BLOOD LEFT HAND  Final   Special Requests   Final    BOTTLES DRAWN AEROBIC AND ANAEROBIC Blood Culture adequate volume   Culture   Final    NO GROWTH < 12 HOURS Performed at [facility], [address] [place], [place] [address]    Report Status PENDING  Incomplete     Labs: Basic Metabolic Panel: Recent Labs  Lab 09/10/22 2234  NA 136  K 3.7  CL 100  CO2 27  GLUCOSE 102*  BUN 9  CREATININE 0.54   CALCIUM 9.2   Liver Function Tests: No results for input(s): "AST", "ALT", "ALKPHOS", "BILITOT", "PROT", "ALBUMIN" in the last 168 hours. No results for input(s): "LIPASE", "AMYLASE" in the last 168 hours. No results for input(s): "AMMONIA" in the last 168 hours. CBC: Recent Labs  Lab 09/10/22 2234  WBC 6.6  HGB 14.5  HCT 43.9  MCV 99.3  PLT 187   Cardiac Enzymes: No results for input(s): "CKTOTAL", "CKMB", "CKMBINDEX", "TROPONINI" in the last 168 hours. BNP: BNP (last 3 results) No results for input(s): "BNP" in the last 8760 hours.  ProBNP (last 3 results) No results for input(s): "PROBNP" in the last 8760 hours.  CBG: No results for input(s): "GLUCAP" in the last 168 hours.     Signed:  Silvano Bilis MD.  [facility] 09/11/2022, 8:40 AM

## 2022-09-11 NOTE — Assessment & Plan Note (Signed)
Patient tachycardic and tachypneic, likely related to COPD on neb treatments.  Sepsis not suspected given normal WBC and lactic acid

## 2022-09-15 LAB — CULTURE, BLOOD (ROUTINE X 2)
Culture: NO GROWTH
Culture: NO GROWTH
Special Requests: ADEQUATE

## 2022-09-16 LAB — CULTURE, BLOOD (ROUTINE X 2): Special Requests: ADEQUATE

## 2022-10-18 ENCOUNTER — Ambulatory Visit: Payer: Self-pay | Admitting: Gastroenterology

## 2022-11-13 ENCOUNTER — Ambulatory Visit
Admission: EM | Admit: 2022-11-13 | Discharge: 2022-11-13 | Disposition: A | Discharge status: Home / Self Care | Payer: Self-pay

## 2022-11-13 ENCOUNTER — Ambulatory Visit (INDEPENDENT_AMBULATORY_CARE_PROVIDER_SITE_OTHER): Payer: Self-pay

## 2022-11-13 ENCOUNTER — Encounter: Payer: Self-pay | Admitting: *Deleted

## 2022-11-13 DIAGNOSIS — J441 Chronic obstructive pulmonary disease with (acute) exacerbation: Secondary | ICD-10-CM

## 2022-11-13 DIAGNOSIS — R0602 Shortness of breath: Secondary | ICD-10-CM

## 2022-11-13 DIAGNOSIS — Z720 Tobacco use: Secondary | ICD-10-CM

## 2022-11-13 DIAGNOSIS — R053 Chronic cough: Secondary | ICD-10-CM

## 2022-11-13 MED ORDER — ALBUTEROL SULFATE (2.5 MG/3ML) 0.083% IN NEBU: 2.5000 mg | INHALATION_SOLUTION | Freq: Four times a day (QID) | RESPIRATORY_TRACT | 1 refills | Status: AC | PRN

## 2022-11-13 MED ORDER — PREDNISONE 20 MG PO TABS: 40.0000 mg | ORAL_TABLET | Freq: Every day | ORAL | 0 refills | Status: AC

## 2022-11-13 NOTE — Discharge Instructions (Addendum)
-  Still waiting on the over read from the radiologist on your chest x-ray.  If you have pneumonia I will send antibiotics to the pharmacy. - I sent prednisone to pharmacy and we gave you a nebulizer.  I sent solution for the nebulizer to the pharmacy so use other this or your albuterol inhaler every 4-6 hours as needed for acute shortness of breath/flareups and continue your daily inhalers. - Try to stop smoking.  This is the only way to really keep your COPD from getting worse. -Continue Mucinex -Return or ER if fever or worsening SOB.

## 2022-11-13 NOTE — ED Triage Notes (Signed)
Patient states lower back pain across entire back for past 2 days, chronic cough but noted O2 sats dropping to 89 today, doesn't use home O2, not feeling short of breath.  Took goody powder for back pain.

## 2022-11-13 NOTE — ED Provider Notes (Signed)
[name]    CSN: [id number] Arrival date & time: 11/13/22  1602      History   Chief Complaint Chief Complaint  Patient presents with   Cough   low o2   Back Pain    HPI April Sweeney is a 57 y.o. female.   COPD, who presents to the emergency department with shortness of breath.  Patient states that she has had worsening shortness of breath today.  Checked her oxygen at home and it was 88% so she came to the emergency department.  Endorses ongoing productive cough since she had pneumonia in May and feels like it has worsened over the past couple of days.  Denies any chest pain but states that she has some back pain from her shortness of breath.  No history of DVT or PE.  Ongoing tobacco use.  No recent trip or travel.  No nausea, vomiting or diaphoresis.  No fever or chills.  States that she has home inhalers and inhaled steroids.  Not followed by pulmonology.  Prior workup with PET scans and CT scans for questionable pulmonary nodules.    HPI  Past Medical History:  Diagnosis Date   COPD (chronic obstructive pulmonary disease) (HCC)    Influenza A 02/13/2021   Thyroid disease    Wears dentures    full upper, partial lower    Patient Active Problem List   Diagnosis Date Noted   COPD with acute exacerbation (HCC) 09/11/2022   Acute respiratory failure with hypoxia (HCC) 09/11/2022   SIRS (systemic inflammatory response syndrome) (HCC) 09/11/2022   Adenomatous polyp of cecum 05/16/2022   Smoker 05/31/2021   COPD (chronic obstructive pulmonary disease) (HCC) 02/13/2021   Hypothyroid 02/13/2021   Hypoxemia 02/13/2021   HTN (hypertension) 02/13/2021   Lung mass 02/13/2021    Past Surgical History:  Procedure Laterality Date   BREAST CYST ASPIRATION Left 1992   CESAREAN SECTION     COLONOSCOPY WITH PROPOFOL N/A 05/16/2022   Procedure: COLONOSCOPY WITH PROPOFOL;  Surgeon: Toney Reil, MD;  Location[facility];  Service: Endoscopy;   Laterality: N/A;   POLYPECTOMY  05/16/2022   Procedure: POLYPECTOMY;  Surgeon: Toney Reil, MD;  Location: [facility];  Service: Endoscopy;;   WRIST GANGLION EXCISION      OB History     Gravida  2   Para  2   Term      Preterm      AB      Living         SAB      IAB      Ectopic      Multiple      Live Births               Home Medications    Prior to Admission medications   Medication Sig Start Date End Date Taking? Authorizing Provider  CHANTIX STARTING MONTH PAK 0.5 MG X 11 & 1 MG X 42 TBPK Take by mouth. 11/09/22  Yes [provider]  ADVAIR DISKUS 500-50 MCG/ACT AEPB Inhale 1 puff into the lungs 2 (two) times daily. 05/30/22   [provider]  albuterol (VENTOLIN HFA) 108 (90 Base) MCG/ACT inhaler Inhale 2 puffs into the lungs every 6 (six) hours as needed for wheezing or shortness of breath. 09/11/22   Wouk, Wilfred Curtis, MD  fluticasone (FLONASE) 50 MCG/ACT nasal spray Place 1 spray into both nostrils daily as needed.    [provider]  levothyroxine (SYNTHROID) 88 MCG tablet Take 88 mcg by mouth daily before breakfast.    [provider]  Multiple Vitamins-Minerals (ZINC PO) Take 1 tablet by mouth at bedtime.    [provider]  Spacer/Aero-Holding Chambers (AEROCHAMBER PLUS) inhaler Use with inhaler 05/19/20   Domenick Gong, MD  tiotropium (SPIRIVA) 18 MCG inhalation capsule Place 18 mcg into inhaler and inhale every morning.    [provider]  Vitamin D-Vitamin K (VITAMIN K2-VITAMIN D3 PO) Take 1 capsule by mouth daily.    [provider]    Family History Family History  Problem Relation Age of Onset   Dementia Mother    Cancer Father    Liver cancer Father    Breast cancer Neg Hx     Social History Social History   Tobacco Use   Smoking status: Every Day    Current packs/day: 0.25    Average packs/day: 0.3 packs/day for 40.0 years (10.0 ttl pk-yrs)     Types: Cigarettes   Smokeless tobacco: Never   Tobacco comments:    Started smoking in early teens  Vaping Use   Vaping status: Never Used  Substance Use Topics   Alcohol use: No   Drug use: Never     Allergies   Percocet [oxycodone-acetaminophen]   Review of Systems Review of Systems   Physical Exam Triage Vital Signs ED Triage Vitals  Encounter Vitals Group     BP 11/13/22 1635 128/73     Systolic BP Percentile --      Diastolic BP Percentile --      Pulse Rate 11/13/22 1635 90     Resp 11/13/22 1635 20     Temp 11/13/22 1635 98.1 F (36.7 C)     Temp Source 11/13/22 1635 Oral     SpO2 11/13/22 1635 97 %     Weight 11/13/22 1632 81 lb (36.7 kg)     Height 11/13/22 1632 5\' 5"  (1.651 m)     Head Circumference --      Peak Flow --      Pain Score 11/13/22 1632 6     Pain Loc --      Pain Education --      Exclude from Growth Chart --    No data found.  Updated Vital Signs BP 128/73 (BP Location: Left Arm)   Pulse 90   Temp 98.1 F (36.7 C) (Oral)   Resp 20   Ht 5\' 5"  (1.651 m)   Wt 81 lb (36.7 kg)   LMP 02/17/2014   SpO2 97%   BMI 13.48 kg/m   Visual Acuity Right Eye Distance:   Left Eye Distance:   Bilateral Distance:    Right Eye Near:   Left Eye Near:    Bilateral Near:     Physical Exam   UC Treatments / Results  Labs (all labs ordered are listed, but only abnormal results are displayed) Labs Reviewed - No data to display  EKG   Radiology No results found.  Procedures Procedures (including critical care time)  Medications Ordered in UC Medications - No data to display  Initial Impression / Assessment and Plan / UC Course  I have reviewed the triage vital signs and the nursing notes.  Pertinent labs & imaging results that were available during my care of the patient were reviewed by me and considered in my medical decision making (see chart for details).     *** Final Clinical Impressions(s) /  UC Diagnoses   Final  diagnoses:  None   Discharge Instructions   None    ED Prescriptions   None    PDMP not reviewed this encounter.

## 2022-12-19 IMAGING — CR DG CHEST 2V
1 series · 2 of 2 positions shown · non-contrast
Comparison: November 28, 2020.

CLINICAL DATA: Shortness of breath, cough.

EXAM:
CHEST - 2 VIEW

[Series 1: dg chest 2 view · 0.14mm/px · 2 of 2 slices shown]
[im 1/2]
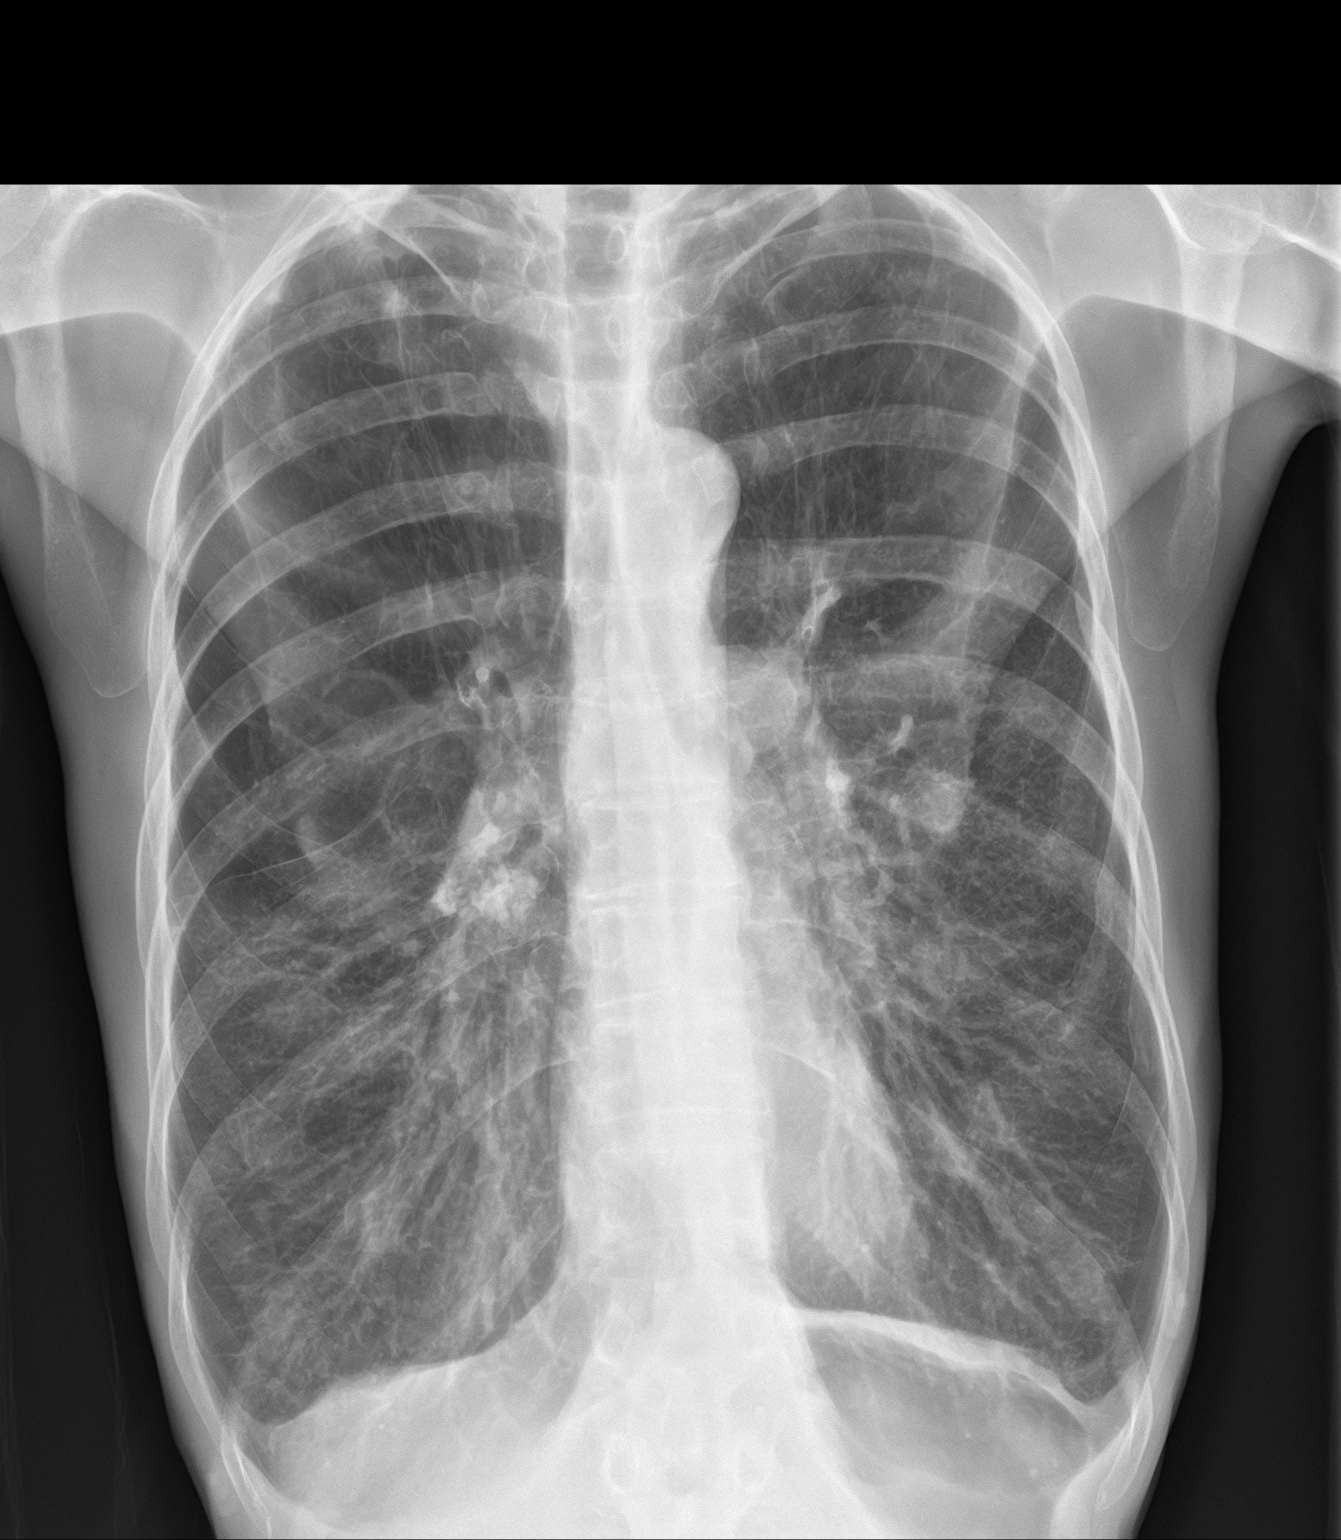
[im 2/2]
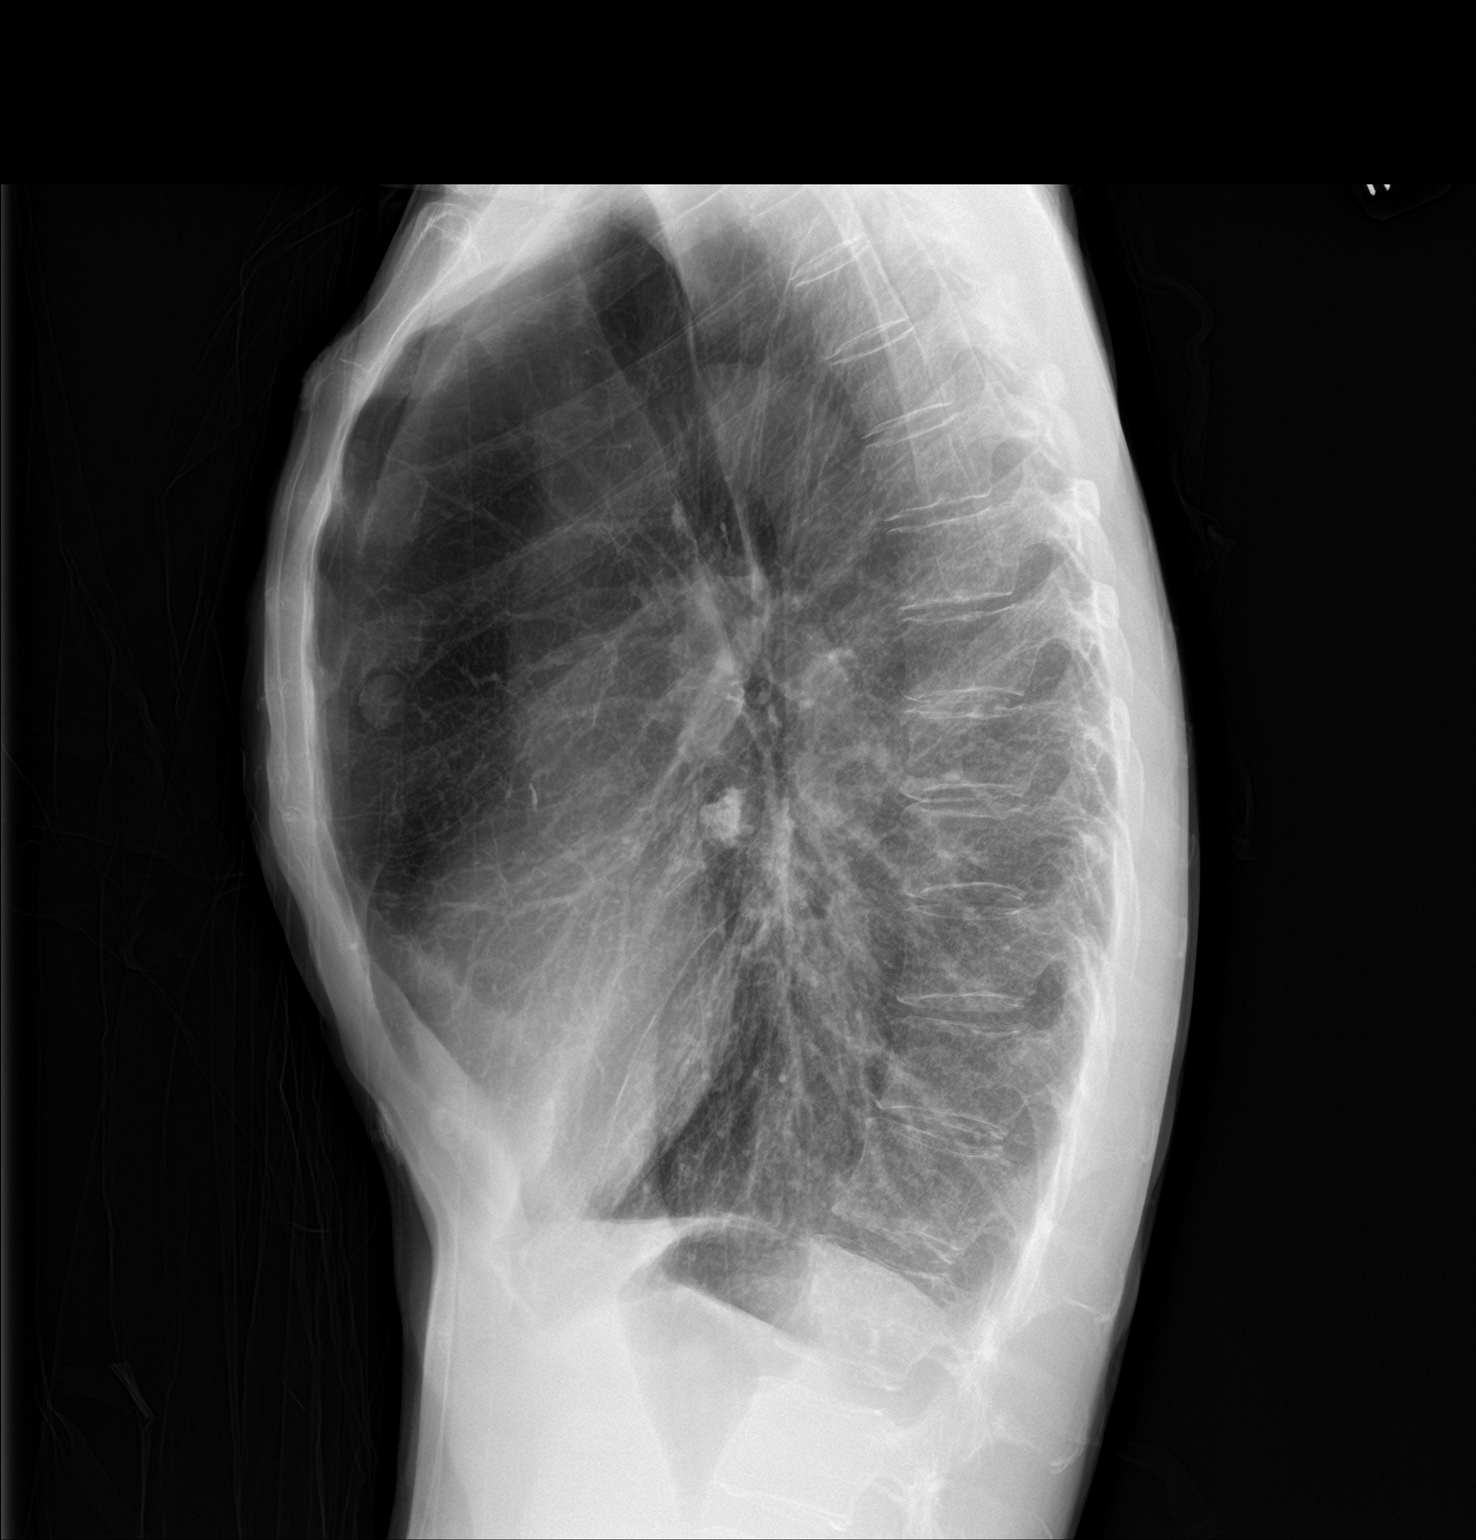

[2 of 2 positions shown; findings below may reference images not displayed]

FINDINGS: The heart size and mediastinal contours are within normal limits.
Hyperexpansion of the lungs is noted with emphysematous disease
noted bilaterally. Faint nodular density is noted in left lung apex.
The visualized skeletal structures are unremarkable.
IMPRESSION: Hyperexpansion and emphysematous change of the lungs is noted
consistent with COPD. Faint nodular density is noted in left lung
apex; CT scan of the chest is recommended to rule out pulmonary
nodule or neoplasm.

Emphysema ([id number]).

## 2023-03-02 ENCOUNTER — Encounter: Payer: Self-pay | Admitting: Emergency Medicine

## 2023-03-02 ENCOUNTER — Emergency Department: Payer: Self-pay

## 2023-03-02 ENCOUNTER — Other Ambulatory Visit: Payer: Self-pay

## 2023-03-02 ENCOUNTER — Inpatient Hospital Stay
Admission: EM | Admit: 2023-03-02 | Discharge: 2023-03-07 | DRG: 871 | Disposition: A | Discharge status: Home / Self Care | Payer: Self-pay | Attending: Internal Medicine | Admitting: Internal Medicine

## 2023-03-02 DIAGNOSIS — J9621 Acute and chronic respiratory failure with hypoxia: Secondary | ICD-10-CM | POA: Diagnosis present

## 2023-03-02 DIAGNOSIS — J129 Viral pneumonia, unspecified: Secondary | ICD-10-CM | POA: Diagnosis present

## 2023-03-02 DIAGNOSIS — Z7901 Long term (current) use of anticoagulants: Secondary | ICD-10-CM

## 2023-03-02 DIAGNOSIS — Z79890 Hormone replacement therapy (postmenopausal): Secondary | ICD-10-CM

## 2023-03-02 DIAGNOSIS — J189 Pneumonia, unspecified organism: Secondary | ICD-10-CM | POA: Diagnosis present

## 2023-03-02 DIAGNOSIS — J210 Acute bronchiolitis due to respiratory syncytial virus: Secondary | ICD-10-CM | POA: Diagnosis present

## 2023-03-02 DIAGNOSIS — B338 Other specified viral diseases: Secondary | ICD-10-CM | POA: Diagnosis present

## 2023-03-02 DIAGNOSIS — E43 Unspecified severe protein-calorie malnutrition: Secondary | ICD-10-CM | POA: Diagnosis present

## 2023-03-02 DIAGNOSIS — J439 Emphysema, unspecified: Secondary | ICD-10-CM | POA: Diagnosis present

## 2023-03-02 DIAGNOSIS — F17200 Nicotine dependence, unspecified, uncomplicated: Secondary | ICD-10-CM | POA: Diagnosis present

## 2023-03-02 DIAGNOSIS — R6520 Severe sepsis without septic shock: Secondary | ICD-10-CM | POA: Diagnosis present

## 2023-03-02 DIAGNOSIS — R911 Solitary pulmonary nodule: Secondary | ICD-10-CM | POA: Diagnosis present

## 2023-03-02 DIAGNOSIS — E039 Hypothyroidism, unspecified: Secondary | ICD-10-CM | POA: Diagnosis present

## 2023-03-02 DIAGNOSIS — R64 Cachexia: Secondary | ICD-10-CM | POA: Diagnosis present

## 2023-03-02 DIAGNOSIS — Z79899 Other long term (current) drug therapy: Secondary | ICD-10-CM

## 2023-03-02 DIAGNOSIS — E876 Hypokalemia: Secondary | ICD-10-CM | POA: Diagnosis present

## 2023-03-02 DIAGNOSIS — Z800 Family history of malignant neoplasm of digestive organs: Secondary | ICD-10-CM

## 2023-03-02 DIAGNOSIS — Z7951 Long term (current) use of inhaled steroids: Secondary | ICD-10-CM

## 2023-03-02 DIAGNOSIS — I10 Essential (primary) hypertension: Secondary | ICD-10-CM | POA: Diagnosis present

## 2023-03-02 DIAGNOSIS — A4189 Other specified sepsis: Principal | ICD-10-CM | POA: Diagnosis present

## 2023-03-02 DIAGNOSIS — F17210 Nicotine dependence, cigarettes, uncomplicated: Secondary | ICD-10-CM | POA: Diagnosis present

## 2023-03-02 DIAGNOSIS — J441 Chronic obstructive pulmonary disease with (acute) exacerbation: Secondary | ICD-10-CM | POA: Diagnosis present

## 2023-03-02 DIAGNOSIS — R7989 Other specified abnormal findings of blood chemistry: Secondary | ICD-10-CM | POA: Diagnosis present

## 2023-03-02 DIAGNOSIS — J9601 Acute respiratory failure with hypoxia: Principal | ICD-10-CM | POA: Diagnosis present

## 2023-03-02 DIAGNOSIS — K5900 Constipation, unspecified: Secondary | ICD-10-CM | POA: Diagnosis not present

## 2023-03-02 DIAGNOSIS — A419 Sepsis, unspecified organism: Secondary | ICD-10-CM | POA: Diagnosis present

## 2023-03-02 DIAGNOSIS — J440 Chronic obstructive pulmonary disease with acute lower respiratory infection: Secondary | ICD-10-CM | POA: Diagnosis present

## 2023-03-02 DIAGNOSIS — Z681 Body mass index (BMI) 19 or less, adult: Secondary | ICD-10-CM

## 2023-03-02 LAB — COMPREHENSIVE METABOLIC PANEL
ALT: 15 U/L (ref 0–44)
AST: 19 U/L (ref 15–41)
Albumin: 3.4 g/dL — ABNORMAL LOW (ref 3.5–5.0)
Alkaline Phosphatase: 83 U/L (ref 38–126)
Anion gap: 12 (ref 5–15)
BUN: 9 mg/dL (ref 6–20)
CO2: 32 mmol/L (ref 22–32)
Calcium: 8.3 mg/dL — ABNORMAL LOW (ref 8.9–10.3)
Chloride: 90 mmol/L — ABNORMAL LOW (ref 98–111)
Creatinine, Ser: 0.58 mg/dL (ref 0.44–1.00)
GFR, Estimated: >60 mL/min (ref >60)
Glucose, Bld: 160 mg/dL — ABNORMAL HIGH (ref 70–99)
Potassium: 3.1 mmol/L — ABNORMAL LOW (ref 3.5–5.1)
Sodium: 134 mmol/L — ABNORMAL LOW (ref 135–145)
Total Bilirubin: 0.5 mg/dL (ref <1.2)
Total Protein: 6.6 g/dL (ref 6.5–8.1)

## 2023-03-02 LAB — TROPONIN I (HIGH SENSITIVITY)
Troponin I (High Sensitivity): 6 ng/L (ref <18)
Troponin I (High Sensitivity): 8 ng/L (ref <18)

## 2023-03-02 LAB — RESP PANEL BY RT-PCR (RSV, FLU A&B, COVID)  RVPGX2
Influenza A by PCR: NEGATIVE
Influenza B by PCR: NEGATIVE
Resp Syncytial Virus by PCR: POSITIVE — AB
SARS Coronavirus 2 by RT PCR: NEGATIVE

## 2023-03-02 LAB — CBC WITH DIFFERENTIAL/PLATELET
Abs Immature Granulocytes: 0.11 10*3/uL — ABNORMAL HIGH (ref 0.00–0.07)
Basophils Absolute: 0.1 10*3/uL (ref 0.0–0.1)
Basophils Relative: 0 %
Eosinophils Absolute: 0.1 10*3/uL (ref 0.0–0.5)
Eosinophils Relative: 1 %
HCT: 40.9 % (ref 36.0–46.0)
Hemoglobin: 13.4 g/dL (ref 12.0–15.0)
Immature Granulocytes: 1 %
Lymphocytes Relative: 7 %
Lymphs Abs: 0.9 10*3/uL (ref 0.7–4.0)
MCH: 33.3 pg (ref 26.0–34.0)
MCHC: 32.8 g/dL (ref 30.0–36.0)
MCV: 101.5 fL — ABNORMAL HIGH (ref 80.0–100.0)
Monocytes Absolute: 0.8 10*3/uL (ref 0.1–1.0)
Monocytes Relative: 6 %
Neutro Abs: 10.8 10*3/uL — ABNORMAL HIGH (ref 1.7–7.7)
Neutrophils Relative %: 85 %
Platelets: 281 10*3/uL (ref 150–400)
RBC: 4.03 MIL/uL (ref 3.87–5.11)
RDW: 13.2 % (ref 11.5–15.5)
WBC: 12.7 10*3/uL — ABNORMAL HIGH (ref 4.0–10.5)
nRBC: 0 % (ref 0.0–0.2)

## 2023-03-02 LAB — D-DIMER, QUANTITATIVE: D-Dimer, Quant: 0.92 ug{FEU}/mL — ABNORMAL HIGH (ref 0.00–0.50)

## 2023-03-02 LAB — PROCALCITONIN: Procalcitonin: <0.1 ng/mL

## 2023-03-02 LAB — PHOSPHORUS: Phosphorus: 3.1 mg/dL (ref 2.5–4.6)

## 2023-03-02 LAB — MAGNESIUM: Magnesium: 1.6 mg/dL — ABNORMAL LOW (ref 1.7–2.4)

## 2023-03-02 MED ORDER — ALBUTEROL SULFATE (2.5 MG/3ML) 0.083% IN NEBU
10.0000 mg | INHALATION_SOLUTION | Freq: Once | RESPIRATORY_TRACT | Status: AC
  Administered 2023-03-02: 10 mg via RESPIRATORY_TRACT
  Filled 2023-03-02: qty 12

## 2023-03-02 MED ORDER — ZINC SULFATE 220 (50 ZN) MG PO CAPS
220.0000 mg | ORAL_CAPSULE | Freq: Every day | ORAL | Status: DC
  Administered 2023-03-05 – 2023-03-07 (×3): 220 mg via ORAL
  Filled 2023-03-02 (×3): qty 1

## 2023-03-02 MED ORDER — ONDANSETRON HCL 4 MG/2ML IJ SOLN: 4.0000 mg | Freq: Three times a day (TID) | INTRAMUSCULAR | Status: DC | PRN

## 2023-03-02 MED ORDER — METHYLPREDNISOLONE SODIUM SUCC 40 MG IJ SOLR
40.0000 mg | Freq: Every day | INTRAMUSCULAR | Status: DC
  Administered 2023-03-03 – 2023-03-04 (×2): 40 mg via INTRAVENOUS
  Filled 2023-03-02 (×2): qty 1

## 2023-03-02 MED ORDER — ACETAMINOPHEN 325 MG PO TABS
650.0000 mg | ORAL_TABLET | Freq: Four times a day (QID) | ORAL | Status: DC | PRN
  Administered 2023-03-03 (×3): 650 mg via ORAL
  Filled 2023-03-02 (×3): qty 2

## 2023-03-02 MED ORDER — NICOTINE 21 MG/24HR TD PT24
21.0000 mg | MEDICATED_PATCH | Freq: Every day | TRANSDERMAL | Status: DC
  Filled 2023-03-02 (×3): qty 1

## 2023-03-02 MED ORDER — HYDRALAZINE HCL 20 MG/ML IJ SOLN: 5.0000 mg | INTRAMUSCULAR | Status: DC | PRN

## 2023-03-02 MED ORDER — ENOXAPARIN SODIUM 30 MG/0.3ML IJ SOSY
30.0000 mg | PREFILLED_SYRINGE | INTRAMUSCULAR | Status: DC
  Administered 2023-03-02 – 2023-03-05 (×4): 30 mg via SUBCUTANEOUS
  Filled 2023-03-02 (×6): qty 0.3

## 2023-03-02 MED ORDER — ALBUTEROL SULFATE (2.5 MG/3ML) 0.083% IN NEBU
2.5000 mg | INHALATION_SOLUTION | RESPIRATORY_TRACT | Status: DC | PRN
  Administered 2023-03-04 – 2023-03-05 (×2): 2.5 mg via RESPIRATORY_TRACT
  Filled 2023-03-02 (×4): qty 3

## 2023-03-02 MED ORDER — SODIUM CHLORIDE 0.9 % IV BOLUS
500.0000 mL | Freq: Once | INTRAVENOUS | Status: AC
  Administered 2023-03-02: 500 mL via INTRAVENOUS

## 2023-03-02 MED ORDER — METHYLPREDNISOLONE SODIUM SUCC 125 MG IJ SOLR: 125.0000 mg | Freq: Once | INTRAMUSCULAR | Status: DC

## 2023-03-02 MED ORDER — IPRATROPIUM-ALBUTEROL 0.5-2.5 (3) MG/3ML IN SOLN
3.0000 mL | Freq: Once | RESPIRATORY_TRACT | Status: AC
  Administered 2023-03-02: 3 mL via RESPIRATORY_TRACT
  Filled 2023-03-02: qty 3

## 2023-03-02 MED ORDER — ENSURE ENLIVE PO LIQD
237.0000 mL | Freq: Two times a day (BID) | ORAL | Status: DC
  Administered 2023-03-05 (×2): 237 mL via ORAL

## 2023-03-02 MED ORDER — POTASSIUM CHLORIDE CRYS ER 20 MEQ PO TBCR
40.0000 meq | EXTENDED_RELEASE_TABLET | Freq: Once | ORAL | Status: AC
  Administered 2023-03-02: 40 meq via ORAL
  Filled 2023-03-02: qty 2

## 2023-03-02 MED ORDER — DM-GUAIFENESIN ER 30-600 MG PO TB12: 1.0000 | ORAL_TABLET | Freq: Two times a day (BID) | ORAL | Status: DC | PRN

## 2023-03-02 MED ORDER — SODIUM CHLORIDE 0.9 % IV SOLN
500.0000 mg | INTRAVENOUS | Status: DC
  Administered 2023-03-02 – 2023-03-04 (×3): 500 mg via INTRAVENOUS
  Filled 2023-03-02 (×5): qty 5

## 2023-03-02 MED ORDER — IOHEXOL 350 MG/ML SOLN
75.0000 mL | Freq: Once | INTRAVENOUS | Status: AC | PRN
  Administered 2023-03-02: 75 mL via INTRAVENOUS

## 2023-03-02 MED ORDER — ZINC 30 MG PO TABS: ORAL_TABLET | Freq: Every day | ORAL | Status: DC

## 2023-03-02 MED ORDER — LEVOTHYROXINE SODIUM 88 MCG PO TABS
88.0000 ug | ORAL_TABLET | Freq: Every day | ORAL | Status: DC
  Administered 2023-03-04 – 2023-03-07 (×4): 88 ug via ORAL
  Filled 2023-03-02 (×5): qty 1

## 2023-03-02 MED ORDER — IPRATROPIUM-ALBUTEROL 0.5-2.5 (3) MG/3ML IN SOLN
3.0000 mL | RESPIRATORY_TRACT | Status: DC
  Administered 2023-03-02 – 2023-03-03 (×6): 3 mL via RESPIRATORY_TRACT
  Filled 2023-03-02: qty 9
  Filled 2023-03-02 (×4): qty 3

## 2023-03-02 MED ORDER — IPRATROPIUM-ALBUTEROL 0.5-2.5 (3) MG/3ML IN SOLN
3.0000 mL | Freq: Once | RESPIRATORY_TRACT | Status: AC
  Administered 2023-03-02: 3 mL via RESPIRATORY_TRACT
  Filled 2023-03-02: qty 12

## 2023-03-02 MED ORDER — SODIUM CHLORIDE 0.9 % IV SOLN
1.0000 g | INTRAVENOUS | Status: DC
  Administered 2023-03-02: 1 g via INTRAVENOUS
  Filled 2023-03-02: qty 10

## 2023-03-02 NOTE — ED Triage Notes (Signed)
Presents via EMS with increased SOB  States started out with cough for couple of days  States she feels tight across chest

## 2023-03-02 NOTE — ED Provider Notes (Signed)
[facility] Provider Note    Event Date/Time   First MD Initiated Contact with Patient 03/02/23 1339     (approximate)   History   Shortness of Breath   HPI April Sweeney is a 57 y.o. female with history of COPD, hypothyroidism presenting today for shortness of breath.  Patient has had worsening shortness of breath over the past 3 days.  Has intermittent productive cough with brown sputum.  Has had congestion as well.  Feels chest tightness.  Otherwise denies fever, body aches, nausea, vomiting, other chest pain, abdominal pain.  She is not normally on oxygen at baseline but reported that she had a pulse oximeter at home that showed her oxygen to be 90% on room air.  Daily smoker which is down to 4 cigarettes/day.  Family numbers at home with similar symptoms.  Received 1 breathing treatment with albuterol and Solu-Medrol 125 mg with EMS.  Chart review: Patient has had multiple ED visits for COPD exacerbations this year 1 time requiring admission.     Physical Exam   Triage Vital Signs: ED Triage Vitals  Encounter Vitals Group     BP 03/02/23 1344 137/87     Systolic BP Percentile --      Diastolic BP Percentile --      Pulse Rate 03/02/23 1344 (!) 110     Resp 03/02/23 1344 (!) 22     Temp 03/02/23 1344 98.1 F (36.7 C)     Temp src --      SpO2 03/02/23 1344 95 %     Weight 03/02/23 1346 82 lb (37.2 kg)     Height 03/02/23 1346 5\' 5"  (1.651 m)     Head Circumference --      Peak Flow --      Pain Score 03/02/23 1345 2     Pain Loc --      Pain Education --      Exclude from Growth Chart --     Most recent vital signs: Vitals:   03/02/23 1344 03/02/23 1845  BP: 137/87   Pulse: (!) 110 95  Resp: (!) 22   Temp: 98.1 F (36.7 C)   SpO2: 95% 100%   Physical Exam: I have reviewed the vital signs and nursing notes. General: Awake, alert, no acute distress.  Nontoxic appearing. Head:  Atraumatic, normocephalic.   ENT:  EOM intact, PERRL.  Oral mucosa is pink and moist with no lesions. Neck: Neck is supple with full range of motion, No meningeal signs. Cardiovascular:  RRR, No murmurs. Peripheral pulses palpable and equal bilaterally. Respiratory:  Symmetrical chest wall expansion.  Expiratory wheeze present with diminished lung sounds throughout.  Slight tachypnea. Musculoskeletal:  No cyanosis or edema. Moving extremities with full ROM Abdomen:  Soft, nontender, nondistended. Neuro:  GCS 15, moving all four extremities, interacting appropriately. Speech clear. Psych:  Calm, appropriate.   Skin:  Warm, dry, no rash.    ED Results / Procedures / Treatments   Labs (all labs ordered are listed, but only abnormal results are displayed) Labs Reviewed  RESP PANEL BY RT-PCR (RSV, FLU A&B, COVID)  RVPGX2 - Abnormal; Notable for the following components:      Result Value   Resp Syncytial Virus by PCR POSITIVE (*)    All other components within normal limits  CBC WITH DIFFERENTIAL/PLATELET - Abnormal; Notable for the following components:   WBC 12.7 (*)    MCV 101.5 (*)    Neutro Abs  10.8 (*)    Abs Immature Granulocytes 0.11 (*)    All other components within normal limits  COMPREHENSIVE METABOLIC PANEL - Abnormal; Notable for the following components:   Sodium 134 (*)    Potassium 3.1 (*)    Chloride 90 (*)    Glucose, Bld 160 (*)    Calcium 8.3 (*)    Albumin 3.4 (*)    All other components within normal limits  D-DIMER, QUANTITATIVE - Abnormal; Notable for the following components:   D-Dimer, Quant 0.92 (*)    All other components within normal limits  CULTURE, BLOOD (ROUTINE X 2)  CULTURE, BLOOD (ROUTINE X 2)  EXPECTORATED SPUTUM ASSESSMENT W GRAM STAIN, RFLX TO RESP C  MAGNESIUM  PHOSPHORUS  LACTIC ACID, PLASMA  LACTIC ACID, PLASMA  PROCALCITONIN  STREP PNEUMONIAE URINARY ANTIGEN  LEGIONELLA PNEUMOPHILA SEROGP 1 UR AG  BASIC METABOLIC PANEL  CBC  TROPONIN I (HIGH SENSITIVITY)  TROPONIN I (HIGH  SENSITIVITY)     EKG My EKG interpretation: Rate of 81, normal sinus rhythm, normal axis, normal intervals.  No acute ST elevations or depressions   RADIOLOGY Independently interpreted chest x-ray and CTA chest with no evidence of PE.  Questionable pneumonia in the right lower lobe   PROCEDURES:  Critical Care performed: Yes, see critical care procedure note(s)  .Critical Care  Performed by: Janith Lima, MD Authorized by: Janith Lima, MD   Critical care provider statement:    Critical care time (minutes):  30   Critical care was necessary to treat or prevent imminent or life-threatening deterioration of the following conditions:  Respiratory failure   Critical care was time spent personally by me on the following activities:  Development of treatment plan with patient or surrogate, discussions with consultants, evaluation of patient's response to treatment, examination of patient, ordering and review of laboratory studies, ordering and review of radiographic studies, ordering and performing treatments and interventions, pulse oximetry, re-evaluation of patient's condition and review of old charts    MEDICATIONS ORDERED IN ED: Medications  ipratropium-albuterol (DUONEB) 0.5-2.5 (3) MG/3ML nebulizer solution 3 mL (has no administration in time range)  albuterol (PROVENTIL) (2.5 MG/3ML) 0.083% nebulizer solution 2.5 mg (has no administration in time range)  dextromethorphan-guaiFENesin (MUCINEX DM) 30-600 MG per 12 hr tablet 1 tablet (has no administration in time range)  nicotine (NICODERM CQ - dosed in mg/24 hours) patch 21 mg (has no administration in time range)  ondansetron (ZOFRAN) injection 4 mg (has no administration in time range)  hydrALAZINE (APRESOLINE) injection 5 mg (has no administration in time range)  acetaminophen (TYLENOL) tablet 650 mg (has no administration in time range)  potassium chloride SA (KLOR-CON M) CR tablet 40 mEq (has no administration in time  range)  feeding supplement (ENSURE ENLIVE / ENSURE PLUS) liquid 237 mL (has no administration in time range)  enoxaparin (LOVENOX) injection 30 mg (has no administration in time range)  sodium chloride 0.9 % bolus 500 mL (has no administration in time range)  ipratropium-albuterol (DUONEB) 0.5-2.5 (3) MG/3ML nebulizer solution 3 mL (3 mLs Nebulization Given 03/02/23 1434)  ipratropium-albuterol (DUONEB) 0.5-2.5 (3) MG/3ML nebulizer solution 3 mL (3 mLs Nebulization Given 03/02/23 1434)  albuterol (PROVENTIL) (2.5 MG/3ML) 0.083% nebulizer solution 10 mg (10 mg Nebulization Given 03/02/23 1553)  iohexol (OMNIPAQUE) 350 MG/ML injection 75 mL (75 mLs Intravenous Contrast Given 03/02/23 1747)     IMPRESSION / MDM / ASSESSMENT AND PLAN / ED COURSE  I reviewed the triage vital signs and  the nursing notes.                              Differential diagnosis includes, but is not limited to, pneumonia, flu, COVID, RSV, pneumothorax, PE, COPD exacerbation  Patient's presentation is most consistent with acute presentation with potential threat to life or bodily function.  Patient is a 57 year old female with history of COPD presenting today for shortness of breath.  On arrival she is wheezing with diminished air movement throughout.  Mild tachypnea but no hypoxia on room air.  Given DuoNeb initially.  Did have improved air movement but wheezing was now more prominent.  Given additional hour-long albuterol treatment.  Laboratory workup notable for being RSV positive with slight leukocytosis.  Suspecting COPD exacerbation secondary to RSV.  D-dimer was elevated and follow-up CTA chest was ordered.  This shows no evidence of PE.  Separately, there was a possible right lower lobe pneumonia.  Discussed the case with hospitalist for admission given that patient was hypoxic at 85% requiring 2 L.  He will perform further workup before potentially starting IV antibiotics as it is more likely related to the  RSV.  The patient is on the cardiac monitor to evaluate for evidence of arrhythmia and/or significant heart rate changes. Clinical Course as of 03/02/23 1911  Fri Mar 02, 2023  1540 Oxygen dropped to 85% and patient was placed on 2 L [DW]    Clinical Course User Index [DW] Janith Lima, MD     FINAL CLINICAL IMPRESSION(S) / ED DIAGNOSES   Final diagnoses:  Acute hypoxic respiratory failure (HCC)  RSV bronchiolitis  COPD exacerbation (HCC)     Rx / DC Orders   ED Discharge Orders     None        Note:  This document was prepared using Dragon voice recognition software and may include unintentional dictation errors.   Janith Lima, MD 03/02/23 [phone]

## 2023-03-02 NOTE — Progress Notes (Signed)
PHARMACIST - PHYSICIAN COMMUNICATION  CONCERNING:  Enoxaparin (Lovenox) for DVT Prophylaxis    RECOMMENDATION: Patient was prescribed enoxaprin 40mg  q24 hours for VTE prophylaxis.   Filed Weights   03/02/23 1346  Weight: 37.2 kg (82 lb)    Body mass index is 13.65 kg/m.  Estimated Creatinine Clearance: 45.6 mL/min (by C-G formula based on SCr of 0.58 mg/dL).  Patient is candidate for enoxaparin 30mg  every 24 hours based on Weight <45kg  DESCRIPTION: Pharmacy has adjusted enoxaparin dose per [facility] policy.  Patient is now receiving enoxaparin 30 mg every 24 hours    Rockwell Alexandria, PharmD Clinical Pharmacist  03/02/2023 7:08 PM

## 2023-03-02 NOTE — H&P (Signed)
History and Physical    April Sweeney EXB:[id number] DOB: 26-Oct-1965 DOA: 03/02/2023  Referring MD/NP/PA:   PCP: [facility] [employer]   Patient coming from:  The patient is coming from home.     Chief Complaint: SOB  HPI: April Sweeney is a 57 y.o. female with medical history significant of COPD not on oxygen, hypothyroidism, hypertension, who presents with shortness of  breath.  Patient states that she has shortness of breath for more than 3 days, which has been progressively worsening.  Patient has dry cough, no fever or chills.  Denies chest pain to me, but reports tightness in her back.  Denies nausea, vomiting, diarrhea or abdominal pain.  No symptoms of UTI.  Patient is normally not using oxygen, but was found to have oxygen desaturation to 85% on room air, which improved 100% on 2 L oxygen in ED. Pt has moderate acute respiratory distress and  difficulty to speak in full sentence,   Data reviewed independently and ED Course: pt was found to have positive PCR for RSV, WBC 12.7, troponin level 6 --> 8, potassium 3.1, GFR> 60, D-dimer 0.92.  Temperature normal, blood pressure 137/87, heart rate 110, RR 22.  Chest x-ray negative.  CTA negative for PE, but showed right lower lobe patchy infiltration.  Patient is admitted to telemetry bed as inpatient.  CTA: No evidence of pulmonary emboli.   Patchy right lower lobe infiltrate new from the prior exam   New 8.5 mm nodular density in the superior segment right lower lobe. Although this may be postinflammatory in nature a Non-contrast chest CT at 6-12 months is recommended. If the nodule is stable at time of repeat CT, then future CT at 18-24 months (from today's scan) is considered optional for low-risk patients, but is recommended for high-risk patients. This recommendation follows the consensus statement: Guidelines for Management of Incidental Pulmonary Nodules Detected on CT Images: From the Fleischner Society 2017;  Radiology 2017; 284:228-243.   Changes consistent with prior granulomatous disease.   Emphysema (ICD10-J43.9).    EKG: I have personally reviewed.  Sinus rhythm, QTc 397, PVC, poor R wave progression, anteroseptal infarction pattern   Review of Systems:   General: no fevers, chills, no body weight gain, has fatigue HEENT: no blurry vision, hearing changes or sore throat Respiratory: has dyspnea, coughing, wheezing CV: no chest pain, no palpitations GI: no nausea, vomiting, abdominal pain, diarrhea, constipation GU: no dysuria, burning on urination, increased urinary frequency, hematuria  Ext: no leg edema Neuro: no unilateral weakness, numbness, or tingling, no vision change or hearing loss Skin: no rash, no skin tear. MSK: No muscle spasm, no deformity, no limitation of range of movement in spin Heme: No easy bruising.  Travel history: No recent long distant travel.   Allergy:  Allergies  Allergen Reactions   Percocet [Oxycodone-Acetaminophen] Hives    Past Medical History:  Diagnosis Date   COPD (chronic obstructive pulmonary disease) (HCC)    Influenza A 02/13/2021   Thyroid disease    Wears dentures    full upper, partial lower    Past Surgical History:  Procedure Laterality Date   BREAST CYST ASPIRATION Left 1992   CESAREAN SECTION     COLONOSCOPY WITH PROPOFOL N/A 05/16/2022   Procedure: COLONOSCOPY WITH PROPOFOL;  Surgeon: Toney Reil, MD;  Location[facility];  Service: Endoscopy;  Laterality: N/A;   POLYPECTOMY  05/16/2022   Procedure: POLYPECTOMY;  Surgeon: Toney Reil, MD;  Location: [facility];  Service: Endoscopy;;   WRIST GANGLION EXCISION      Social History:  reports that she has been smoking cigarettes. She has a 10 pack-year smoking history. She has never used smokeless tobacco. She reports that she does not drink alcohol and does not use drugs.  Family History:  Family History  Problem Relation Age of Onset    Dementia Mother    Cancer Father    Liver cancer Father    Breast cancer Neg Hx      Prior to Admission medications   Medication Sig Start Date End Date Taking? Authorizing Provider  ADVAIR DISKUS 500-50 MCG/ACT AEPB Inhale 1 puff into the lungs 2 (two) times daily. 05/30/22   [provider]  albuterol (PROVENTIL) (2.5 MG/3ML) 0.083% nebulizer solution Take 3 mLs (2.5 mg total) by nebulization every 6 (six) hours as needed for wheezing or shortness of breath. 11/13/22   Shirlee Latch, PA-C  albuterol (VENTOLIN HFA) 108 (90 Base) MCG/ACT inhaler Inhale 2 puffs into the lungs every 6 (six) hours as needed for wheezing or shortness of breath. 09/11/22   Wouk, Wilfred Curtis, MD  CHANTIX STARTING MONTH PAK 0.5 MG X 11 & 1 MG X 42 TBPK Take by mouth. 11/09/22   [provider]  fluticasone (FLONASE) 50 MCG/ACT nasal spray Place 1 spray into both nostrils daily as needed.    [provider]  levothyroxine (SYNTHROID) 88 MCG tablet Take 88 mcg by mouth daily before breakfast.    [provider]  Multiple Vitamins-Minerals (ZINC PO) Take 1 tablet by mouth at bedtime.    [provider]  Spacer/Aero-Holding Chambers (AEROCHAMBER PLUS) inhaler Use with inhaler 05/19/20   Domenick Gong, MD  tiotropium (SPIRIVA) 18 MCG inhalation capsule Place 18 mcg into inhaler and inhale every morning.    [provider]  Vitamin D-Vitamin K (VITAMIN K2-VITAMIN D3 PO) Take 1 capsule by mouth daily.    [provider]    Physical Exam: Vitals:   03/02/23 1346 03/02/23 1845 03/02/23 2000 03/02/23 2030  BP:   121/69 137/74  Pulse:  95 92 95  Resp:      Temp:      SpO2:  100% 100% 99%  Weight: 37.2 kg     Height: 5\' 5"  (1.651 m)      General: has moderate acute respiratory distress.  Thin body habitus HEENT:       Eyes: PERRL, EOMI, no jaundice       ENT: No discharge from the ears and nose, no pharynx injection, no tonsillar enlargement.         Neck: No JVD, no bruit, no mass felt. Heme: No neck lymph node enlargement. Cardiac: S1/S2, RRR, No murmurs, No gallops or rubs. Respiratory: Has severely decreased air movement bilaterally, with mild wheezing GI: Soft, nondistended, nontender, no rebound pain, no organomegaly, BS present. GU: No hematuria Ext: No pitting leg edema bilaterally. 1+DP/PT pulse bilaterally. Musculoskeletal: No joint deformities, No joint redness or warmth, no limitation of ROM in spin. Skin: No rashes.  Neuro: Alert, oriented X3, cranial nerves II-XII grossly intact, moves all extremities normally Psych: Patient is not psychotic, no suicidal or hemocidal ideation.  Labs on Admission: I have personally reviewed following labs and imaging studies  CBC: Recent Labs  Lab 03/02/23 1411  WBC 12.7*  NEUTROABS 10.8*  HGB 13.4  HCT 40.9  MCV 101.5*  PLT 281   Basic Metabolic Panel: Recent Labs  Lab 03/02/23 1550  NA 134*  K 3.1*  CL 90*  CO2 32  GLUCOSE 160*  BUN 9  CREATININE 0.58  CALCIUM 8.3*   GFR: Estimated Creatinine Clearance: 45.6 mL/min (by C-G formula based on SCr of 0.58 mg/dL). Liver Function Tests: Recent Labs  Lab 03/02/23 1550  AST 19  ALT 15  ALKPHOS 83  BILITOT 0.5  PROT 6.6  ALBUMIN 3.4*   No results for input(s): "LIPASE", "AMYLASE" in the last 168 hours. No results for input(s): "AMMONIA" in the last 168 hours. Coagulation Profile: No results for input(s): "INR", "PROTIME" in the last 168 hours. Cardiac Enzymes: No results for input(s): "CKTOTAL", "CKMB", "CKMBINDEX", "TROPONINI" in the last 168 hours. BNP (last 3 results) No results for input(s): "PROBNP" in the last 8760 hours. HbA1C: No results for input(s): "HGBA1C" in the last 72 hours. CBG: No results for input(s): "GLUCAP" in the last 168 hours. Lipid Profile: No results for input(s): "CHOL", "HDL", "LDLCALC", "TRIG", "CHOLHDL", "LDLDIRECT" in the last 72 hours. Thyroid Function Tests: No results for  input(s): "TSH", "T4TOTAL", "FREET4", "T3FREE", "THYROIDAB" in the last 72 hours. Anemia Panel: No results for input(s): "VITAMINB12", "FOLATE", "FERRITIN", "TIBC", "IRON", "RETICCTPCT" in the last 72 hours. Urine analysis:    Component Value Date/Time   COLORURINE YELLOW 04/29/2015 1028   APPEARANCEUR CLEAR 04/29/2015 1028   LABSPEC <1.005 (L) 04/29/2015 1028   PHURINE 6.0 04/29/2015 1028   GLUCOSEU NEGATIVE 04/29/2015 1028   HGBUR NEGATIVE 04/29/2015 1028   BILIRUBINUR NEGATIVE 04/29/2015 1028   KETONESUR NEGATIVE 04/29/2015 1028   PROTEINUR NEGATIVE 04/29/2015 1028   NITRITE NEGATIVE 04/29/2015 1028   LEUKOCYTESUR NEGATIVE 04/29/2015 1028   Sepsis Labs: @LABRCNTIP (procalcitonin:4,lacticidven:4) ) Recent Results (from the past 240 hours)  Resp panel by RT-PCR (RSV, Flu A&B, Covid) Anterior Nasal Swab     Status: Abnormal   Collection Time: 03/02/23  2:11 PM   Specimen: Anterior Nasal Swab  Result Value Ref Range Status   SARS Coronavirus 2 by RT PCR NEGATIVE NEGATIVE Final    Comment: (NOTE) SARS-CoV-2 target nucleic acids are NOT DETECTED.  The SARS-CoV-2 RNA is generally detectable in upper respiratory specimens during the acute phase of infection. The lowest concentration of SARS-CoV-2 viral copies this assay can detect is 138 copies/mL. A negative result does not preclude SARS-Cov-2 infection and should not be used as the sole basis for treatment or other patient management decisions. A negative result may occur with  improper specimen collection/handling, submission of specimen other than nasopharyngeal swab, presence of viral mutation(s) within the areas targeted by this assay, and inadequate number of viral copies(<138 copies/mL). A negative result must be combined with clinical observations, patient history, and epidemiological information. The expected result is Negative.  Fact Sheet for Patients:  BloggerCourse.com  Fact Sheet for  Healthcare Providers:  SeriousBroker.it  This test is no t yet approved or cleared by the [place] FDA and  has been authorized for detection and/or diagnosis of SARS-CoV-2 by FDA under an Emergency Use Authorization (EUA). This EUA will remain  in effect (meaning this test can be used) for the duration of the COVID-19 declaration under Section 564(b)(1) of the Act, 21 U.S.C.section 360bbb-3(b)(1), unless the authorization is terminated  or revoked sooner.       Influenza A by PCR NEGATIVE NEGATIVE Final   Influenza B by PCR NEGATIVE NEGATIVE Final    Comment: (NOTE) The Xpert Xpress SARS-CoV-2/FLU/RSV plus assay is intended as an aid in the diagnosis of influenza from Nasopharyngeal swab specimens and  should not be used as a sole basis for treatment. Nasal washings and aspirates are unacceptable for Xpert Xpress SARS-CoV-2/FLU/RSV testing.  Fact Sheet for Patients: BloggerCourse.com  Fact Sheet for Healthcare Providers: SeriousBroker.it  This test is not yet approved or cleared by the [place] FDA and has been authorized for detection and/or diagnosis of SARS-CoV-2 by FDA under an Emergency Use Authorization (EUA). This EUA will remain in effect (meaning this test can be used) for the duration of the COVID-19 declaration under Section 564(b)(1) of the Act, 21 U.S.C. section 360bbb-3(b)(1), unless the authorization is terminated or revoked.     Resp Syncytial Virus by PCR POSITIVE (A) NEGATIVE Final    Comment: (NOTE) Fact Sheet for Patients: BloggerCourse.com  Fact Sheet for Healthcare Providers: SeriousBroker.it  This test is not yet approved or cleared by the [place] FDA and has been authorized for detection and/or diagnosis of SARS-CoV-2 by FDA under an Emergency Use Authorization (EUA). This EUA will remain in effect (meaning  this test can be used) for the duration of the COVID-19 declaration under Section 564(b)(1) of the Act, 21 U.S.C. section 360bbb-3(b)(1), unless the authorization is terminated or revoked.  Performed at [facility], [address] [place], [place] [address]      Radiological Exams on Admission: CT Angio Chest PE W and/or Wo Contrast Result Date: 03/02/2023 CLINICAL DATA:  Increasing shortness of breath EXAM: CT ANGIOGRAPHY CHEST WITH CONTRAST TECHNIQUE: Multidetector CT imaging of the chest was performed using the standard protocol during bolus administration of intravenous contrast. Multiplanar CT image reconstructions and MIPs were obtained to evaluate the vascular anatomy. RADIATION DOSE REDUCTION: This exam was performed according to the departmental dose-optimization program which includes automated exposure control, adjustment of the mA and/or kV according to patient size and/or use of iterative reconstruction technique. CONTRAST:  75mL OMNIPAQUE IOHEXOL 350 MG/ML SOLN COMPARISON:  Chest x-ray from earlier in the same day, CT from 12/20/2011, PET-CT from 02/16/2021 FINDINGS: Cardiovascular: Thoracic aorta and its branches are within normal limits. No aneurysmal dilatation or dissection is seen. No cardiac enlargement is noted. No significant coronary calcifications are noted. The pulmonary artery shows a normal branching pattern bilaterally. No filling defect to suggest pulmonary embolism is seen at this time. Mediastinum/Nodes: Thoracic inlet is within normal limits. Scattered calcified right hilar and mediastinal nodes are seen consistent with prior granulomatous disease. This is stable from the prior exam. The esophagus as visualized is within normal limits. Lungs/Pleura: Lungs are well aerated bilaterally. Patchy infiltrate is seen in the right lower lobe posteriorly. In the superior segment of the right lower lobe there is a somewhat more nodular density measuring up to 8.5 mm.  This may also be postinflammatory in nature. Severe emphysematous changes are noted in the upper lobes bilaterally. Upper Abdomen: Visualized upper abdomen shows a few calcified splenic granulomas. Musculoskeletal: No chest wall abnormality. No acute or significant osseous findings. Review of the MIP images confirms the above findings. IMPRESSION: No evidence of pulmonary emboli. Patchy right lower lobe infiltrate new from the prior exam New 8.5 mm nodular density in the superior segment right lower lobe. Although this may be postinflammatory in nature a Non-contrast chest CT at 6-12 months is recommended. If the nodule is stable at time of repeat CT, then future CT at 18-24 months (from today's scan) is considered optional for low-risk patients, but is recommended for high-risk patients. This recommendation follows the consensus statement: Guidelines for Management of Incidental Pulmonary Nodules Detected on CT  Images: From the Fleischner Society 2017; Radiology 2017; [phone]. Changes consistent with prior granulomatous disease. Emphysema (ICD10-J43.9). Electronically Signed   By: Alcide Clever M.D.   On: 03/02/2023 18:30   DG Chest 2 View Result Date: 03/02/2023 CLINICAL DATA:  Shortness of breath. EXAM: CHEST - 2 VIEW COMPARISON:  11/13/2022. FINDINGS: Bilateral lungs appear hyperexpanded and hyperlucent with coarse bronchovascular markings, in keeping with COPD. Bilateral lungs otherwise appear clear. No dense consolidation or lung collapse. Bilateral costophrenic angles are clear. Normal cardio-mediastinal silhouette. No acute osseous abnormalities. The soft tissues are within normal limits. IMPRESSION: No active cardiopulmonary disease.  COPD. Electronically Signed   By: Jules Schick M.D.   On: 03/02/2023 15:44      Assessment/Plan Principal Problem:   Acute respiratory failure with hypoxia (HCC) Active Problems:   COPD with acute exacerbation (HCC)   RSV infection   CAP (community acquired  pneumonia)   Sepsis (HCC)   HTN (hypertension)   Hypothyroidism   Hypokalemia   Positive D-dimer   Smoker   Lung nodule   Protein-calorie malnutrition, severe (HCC)   Assessment and Plan:  Acute respiratory failure with hypoxia and sepsis due to RSV infection, COPD exacerbation and possible CAP.  CTA negative for PE, but showed right lower lobe patchy infiltration.  Patient has leukocytosis with WBC 12.7, indicating possible superimposed bacterial pneumonia.  Patient has 2 L new oxygen requirement with moderate respiratory distress. Pt meets criteria for sepsis with WBC 12.7, heart rate of 110, RR 22.  - Will admit to tele bed as inpt - IV Rocephin and azithromycin - Incentive spirometry - Solu-Medrol 40 mg daily after 125 mg given by EMS - Mucinex for cough  - Bronchodilators - Urine legionella and S. pneumococcal antigen - Follow up blood culture x2, sputum culture - will get Procalcitonin and trend lactic acid level per sepsis protocol - IVF: 500L of NS bolus  HTN (hypertension): Blood pressure 137/87.  Patient is not taking medications currently. -IV hydralazine as needed  Hypothyroidism -Synthroid  Hypokalemia -Repleted potassium -Check magnesium level -Check phosphorus level  Positive D-dimer -Follow-up lower extremity venous Doppler to rule out a DVT  Smoker -Nicotine patch  Lung nodule: This is incidental finding by CTA -Need to follow-up with PCP as outpatient  Protein-calorie malnutrition, severe (HCC): Body weight 77.2 kg, BMI 13.65 -Nutrition consult -Ensure      DVT ppx: SQ Lovenox  Code Status: Full code     Family Communication:  Yes, patient's daughter   at bed side.   Disposition Plan:  Anticipate discharge back to previous environment  Consults called: None  Admission status and Level of care: Telemetry Medical:  as inpt     Dispo: The patient is from: Home              Anticipated d/c is to: Home              Anticipated d/c date  is: 2 days              Patient currently is not medically stable to d/c.    Severity of Illness:  The appropriate patient status for this patient is INPATIENT. Inpatient status is judged to be reasonable and necessary in order to provide the required intensity of service to ensure the patient's safety. The patient's presenting symptoms, physical exam findings, and initial radiographic and laboratory data in the context of their chronic comorbidities is felt to place them at high risk for further clinical deterioration.  Furthermore, it is not anticipated that the patient will be medically stable for discharge from the hospital within 2 midnights of admission.   * I certify that at the point of admission it is my clinical judgment that the patient will require inpatient hospital care spanning beyond 2 midnights from the point of admission due to high intensity of service, high risk for further deterioration and high frequency of surveillance required.*       Date of Service 03/02/2023    Lorretta Harp [facility]   If 7PM-7AM, please contact night-coverage [email or web address] 03/02/2023, 9:08 PM

## 2023-03-02 NOTE — ED Notes (Addendum)
Patient noted to be 85% on room air- placed on 2L at this time. Patient is now 98% on 2L

## 2023-03-03 ENCOUNTER — Inpatient Hospital Stay: Payer: Self-pay

## 2023-03-03 DIAGNOSIS — J9601 Acute respiratory failure with hypoxia: Secondary | ICD-10-CM

## 2023-03-03 LAB — BLOOD GAS, VENOUS
Acid-Base Excess: 7.2 mmol/L — ABNORMAL HIGH (ref 0.0–2.0)
Bicarbonate: 36.4 mmol/L — ABNORMAL HIGH (ref 20.0–28.0)
O2 Saturation: 90.8 %
Patient temperature: 37
pCO2, Ven: 74 mm[Hg] (ref 44–60)
pH, Ven: 7.3 (ref 7.25–7.43)
pO2, Ven: 61 mm[Hg] — ABNORMAL HIGH (ref 32–45)

## 2023-03-03 LAB — CBC
HCT: 40.2 % (ref 36.0–46.0)
Hemoglobin: 13 g/dL (ref 12.0–15.0)
MCH: 32.3 pg (ref 26.0–34.0)
MCHC: 32.3 g/dL (ref 30.0–36.0)
MCV: 99.8 fL (ref 80.0–100.0)
Platelets: 301 10*3/uL (ref 150–400)
RBC: 4.03 MIL/uL (ref 3.87–5.11)
RDW: 13.2 % (ref 11.5–15.5)
WBC: 10.2 10*3/uL (ref 4.0–10.5)
nRBC: 0 % (ref 0.0–0.2)

## 2023-03-03 LAB — BASIC METABOLIC PANEL
Anion gap: 11 (ref 5–15)
BUN: 7 mg/dL (ref 6–20)
CO2: 31 mmol/L (ref 22–32)
Calcium: 9.2 mg/dL (ref 8.9–10.3)
Chloride: 98 mmol/L (ref 98–111)
Creatinine, Ser: 0.49 mg/dL (ref 0.44–1.00)
GFR, Estimated: >60 mL/min (ref >60)
Glucose, Bld: 120 mg/dL — ABNORMAL HIGH (ref 70–99)
Potassium: 4.2 mmol/L (ref 3.5–5.1)
Sodium: 140 mmol/L (ref 135–145)

## 2023-03-03 LAB — LACTIC ACID, PLASMA: Lactic Acid, Venous: 3.1 mmol/L (ref 0.5–1.9)

## 2023-03-03 LAB — BRAIN NATRIURETIC PEPTIDE: B Natriuretic Peptide: 111.4 pg/mL — ABNORMAL HIGH (ref 0.0–100.0)

## 2023-03-03 LAB — STREP PNEUMONIAE URINARY ANTIGEN: Strep Pneumo Urinary Antigen: NEGATIVE

## 2023-03-03 MED ORDER — MAGNESIUM SULFATE 2 GM/50ML IV SOLN
2.0000 g | Freq: Once | INTRAVENOUS | Status: AC
  Administered 2023-03-03: 2 g via INTRAVENOUS
  Filled 2023-03-03: qty 50

## 2023-03-03 MED ORDER — MORPHINE SULFATE (PF) 2 MG/ML IV SOLN
2.0000 mg | Freq: Once | INTRAVENOUS | Status: AC
Start: 2023-03-03 — End: 2023-03-03
  Administered 2023-03-03: 2 mg via INTRAVENOUS
  Filled 2023-03-03: qty 1

## 2023-03-03 NOTE — Progress Notes (Signed)
  Progress Note   Patient: April Sweeney ZOX:[id number] DOB: Jun 20, 1965 DOA: 03/02/2023     1 DOS: the patient was seen and examined on 03/03/2023   Brief hospital course: Chaela TOMARRA LAGE is a 57 y.o. female with medical history significant of COPD not on oxygen, hypothyroidism, hypertension, who presents with shortness of  breath.  Symptoms started 3 days ago, progressively getting worse.  By time of arrival in the emergency room, she has severe hypoxia with oxygenation 85%.  CT chest ruled out PE, but showed right lower lobe patchy infiltrates. Patient started on IV antibiotics as well as steroids.  She is also requiring BiPAP to maintain oxygenation.   Principal Problem:   Acute respiratory failure with hypoxia (HCC) Active Problems:   COPD with acute exacerbation (HCC)   RSV infection   CAP (community acquired pneumonia)   Sepsis (HCC)   HTN (hypertension)   Hypothyroidism   Hypokalemia   Positive D-dimer   Smoker   Lung nodule   Protein-calorie malnutrition, severe (HCC)   Assessment and Plan: Acute respiratory failure with hypoxia and sepsis due to RSV infection, COPD exacerbation Right lower lobe viral pneumonia.   CTA negative for PE, but showed right lower lobe patchy infiltration.  Pt meets criteria for sepsis with WBC 12.7, heart rate of 110, RR 22. She has been requiring BiPAP since admission. Continue BiPAP for now, continue steroids.  Scheduled bronchodilator. Procalcitonin level less than 0.1, will continue Zithromax for COPD exacerbation, discontinue Rocephin.   HTN (hypertension):  Continue to follow.   Hypothyroidism -Synthroid   Hypokalemia Hypomagnesemia. Received magnesium and potassium.   Positive D-dimer Bilateral lower extremity DVT negative.   Smoker -Nicotine patch   Lung nodule: This is incidental finding by CTA -Need to follow-up with PCP as outpatient   Protein-calorie malnutrition, severe (HCC): Body weight 77.2 kg, BMI 13.65 -Nutrition  consult -Ensure      Subjective:  Patient still requiring BiPAP, short of breath with exertion.  Cough, nonproductive.  No chest pain.  Physical Exam: Vitals:   03/03/23 0736 03/03/23 1013 03/03/23 1013 03/03/23 1117  BP:   100/62 120/65  Pulse:   89 87  Resp:   18 20  Temp: 98.7 F (37.1 C) 97.9 F (36.6 C)  98.8 F (37.1 C)  TempSrc: Axillary Axillary  Axillary  SpO2:   96% (!) 81%  Weight:      Height:       General exam: Appears severely malnourished. Respiratory system: Barrel chest, decreased breathing sounds with little air movement. Respiratory effort normal. Cardiovascular system: S1 & S2 heard, RRR. No JVD, murmurs, rubs, gallops or clicks. No pedal edema. Gastrointestinal system: Abdomen is nondistended, soft and nontender. No organomegaly or masses felt. Normal bowel sounds heard. Central nervous system: Alert and oriented x3. No focal neurological deficits. Extremities: Severe muscle atrophy. Skin: No rashes, lesions or ulcers Psychiatry: Judgement and insight appear normal. Mood & affect appropriate.    Data Reviewed:  CT scan, ultrasound results reviewed.  Lab results reviewed.  Family Communication: Husband updated at bedside.  Disposition: Status is: Inpatient Remains inpatient appropriate because: Severity of disease, IV treatment.     Time spent: 50 minutes  Author: Marrion Coy, MD 03/03/2023 11:22 AM  For on call review [email or web address].

## 2023-03-03 NOTE — Hospital Course (Addendum)
April Sweeney is a 57 y.o. female with medical history significant of COPD not on oxygen, hypothyroidism, hypertension, who presents with shortness of  breath.  Symptoms started 3 days ago, progressively getting worse.  By time of arrival in the emergency room, she has severe hypoxia with oxygenation 85%.  CT chest ruled out PE, but showed right lower lobe patchy infiltrates.  She is also RSV positive. Patient started on IV antibiotics as well as steroids.  She is also requiring BiPAP to maintain oxygenation.

## 2023-03-03 NOTE — ED Notes (Signed)
Sent down VBG and informed RT of 8am breathing tx while Pt is on Bipap. Pt is tolerating well, breathing controlled and even. GCS 15

## 2023-03-03 NOTE — Progress Notes (Signed)
  Manuela Schwartz, NP Nurse Practitioner Hospital[facility]   Progress Notes     Signed   Date of Service: 03/03/2023  6:56 AM   Signed                                                          CROSS COVER NOTE   NAME: April Sweeney MRN: [id number] DOB : 12/19/1976       Concern as stated by nurse / staff   Rapid response was called due to resp distress        Pertinent findings on chart review:    Assessment and  Interventions   Assessment: COPD exacerbation - poor air movement - tripod   position sats stable  Alert and oriented - obvious distress/anxious sats 98%   Plan: BIPAP for work of breathing Mag 2 gm Morphine 2 mg Vbg 1 hour      Donnie Mesa NP [facility] Cross Cover 7pm-7am - check amion for availability Pager [phone]

## 2023-03-03 NOTE — ED Notes (Addendum)
Pt assisted to bedside commode, Pt has difficulty breathing and breathing becomes noticeably labored with movement. Increased O2 to 6L. Pts sats never went below 89%. After 5 mins, Pts breathing has returned to baseline- still on 6L April Sweeney

## 2023-03-03 NOTE — ED Notes (Signed)
Pt was downgraded to 3L [place], O2 sats are @97 -99%, Pts breathing is regular and unlabored currently, will continue to re-assess

## 2023-03-04 MED ORDER — IPRATROPIUM-ALBUTEROL 0.5-2.5 (3) MG/3ML IN SOLN
3.0000 mL | Freq: Four times a day (QID) | RESPIRATORY_TRACT | Status: DC
  Administered 2023-03-04 – 2023-03-07 (×13): 3 mL via RESPIRATORY_TRACT
  Filled 2023-03-04 (×13): qty 3

## 2023-03-04 MED ORDER — MORPHINE SULFATE (PF) 2 MG/ML IV SOLN
2.0000 mg | Freq: Once | INTRAVENOUS | Status: AC
  Administered 2023-03-04: 2 mg via INTRAVENOUS
  Filled 2023-03-04: qty 1

## 2023-03-04 MED ORDER — ALPRAZOLAM 0.25 MG PO TABS
0.2500 mg | ORAL_TABLET | Freq: Once | ORAL | Status: AC
Start: 2023-03-04 — End: 2023-03-04
  Administered 2023-03-04: 0.25 mg via ORAL
  Filled 2023-03-04: qty 1

## 2023-03-04 MED ORDER — METHYLPREDNISOLONE SODIUM SUCC 40 MG IJ SOLR
40.0000 mg | Freq: Two times a day (BID) | INTRAMUSCULAR | Status: DC
  Administered 2023-03-04 – 2023-03-07 (×6): 40 mg via INTRAVENOUS
  Filled 2023-03-04 (×6): qty 1

## 2023-03-04 MED ORDER — IBUPROFEN 400 MG PO TABS
400.0000 mg | ORAL_TABLET | Freq: Three times a day (TID) | ORAL | Status: DC | PRN
  Administered 2023-03-04 – 2023-03-05 (×3): 400 mg via ORAL
  Filled 2023-03-04 (×3): qty 1

## 2023-03-04 NOTE — Progress Notes (Addendum)
Orders for continuous Bipap. Patient keeps taking the bipap mask off. Patient in room with mask off o2 sat at 84%, reapplied bipap o2 sats now 93%. Educated patient to keep the mask on. MD Zhang aware and notified. Currently no bed available on progressive unit.

## 2023-03-04 NOTE — Progress Notes (Signed)
Made Dr. Chipper Herb aware that patient is c/o back pain that is achy and states she takes Advil for it at home. MD gave order for ibuprofen 400 mg PO q8H PRN pain.

## 2023-03-04 NOTE — Progress Notes (Signed)
° °      CROSS COVER NOTE  NAME: April Sweeney MRN: [id number] DOB : 20-Jul-1965    Concern as stated by nurse / staff   Acute SOB with decreased sats     Pertinent findings on chart review:   Assessment and  Interventions   Assessment:\ Poor air movement with sat dropped as low to 76 - oxygen increased to 7 L by nurse Patient with high - discussed plan Plan: Morphine x1, and xanax ineffective in improving sxs Place on BIPAP prn work of breathing Transfer to progressivve       Donnie Mesa NP [facility] [employer] Cover 7pm-7am - check amion for availability Pager [phone]

## 2023-03-04 NOTE — Progress Notes (Addendum)
°  Progress Note   Patient: April Sweeney QIH:[id number] DOB: Dec 18, 1965 DOA: 03/02/2023     2 DOS: the patient was seen and examined on 03/04/2023   Brief hospital course: Takeysha ALIANIS VANSICKLE is a 57 y.o. female with medical history significant of COPD not on oxygen, hypothyroidism, hypertension, who presents with shortness of  breath.  Symptoms started 3 days ago, progressively getting worse.  By time of arrival in the emergency room, she has severe hypoxia with oxygenation 85%.  CT chest ruled out PE, but showed right lower lobe patchy infiltrates.  She is also RSV positive. Patient started on IV antibiotics as well as steroids.  She is also requiring BiPAP to maintain oxygenation.   Principal Problem:   Acute respiratory failure with hypoxia (HCC) Active Problems:   COPD with acute exacerbation (HCC)   RSV infection   CAP (community acquired pneumonia)   Sepsis (HCC)   HTN (hypertension)   Hypothyroidism   Hypokalemia   Positive D-dimer   Smoker   Lung nodule   Protein-calorie malnutrition, severe (HCC)   Assessment and Plan: Acute respiratory failure with hypoxia and severe sepsis due to RSV infection, COPD exacerbation Right lower lobe viral pneumonia.   CTA negative for PE, but showed right lower lobe patchy infiltration.  Pt meets criteria for sepsis with WBC 12.7, heart rate of 110, RR 22.  Patient also had a lactic acid level 3.1, meet severe sepsis criteria. She has been requiring BiPAP since admission. Patient was off BiPAP yesterday evening, but back on BiPAP earlier this morning.  I will increase steroids to 40 mg twice a day.  Continue Eliquis continue bronchodilator.  Patient condition is still critical, will follow closely.   HTN (hypertension):  Continue to follow.   Hypothyroidism -Synthroid   Hypokalemia Hypomagnesemia. Received magnesium and potassium.   Positive D-dimer Bilateral lower extremity DVT negative.   Smoker -Nicotine patch   Lung nodule: This  is incidental finding by CTA -Need to follow-up with PCP as outpatient   Protein-calorie malnutrition, severe (HCC): Body weight 77.2 kg, BMI 13.65 -Nutrition consult -Ensure       Subjective:  Patient had a worsening short of breath earlier this morning, was placed on BiPAP again.  Still short of breath with min exertion.  Physical Exam: Vitals:   03/04/23 0608 03/04/23 0635 03/04/23 0852 03/04/23 0930  BP:   112/68   Pulse:   86   Resp:   16   Temp:      TempSrc:      SpO2: 95% 96% 100% 100%  Weight:      Height:       General exam: Appears calm and comfortable, cachectic. Respiratory system: Decreased breath sounds, very little air movement. Respiratory effort normal. Cardiovascular system: S1 & S2 heard, RRR. No JVD, murmurs, rubs, gallops or clicks. No pedal edema. Gastrointestinal system: Abdomen is nondistended, soft and nontender. No organomegaly or masses felt. Normal bowel sounds heard. Central nervous system: Alert and oriented. No focal neurological deficits. Extremities: Severe muscle atrophy. Skin: No rashes, lesions or ulcers Psychiatry: Judgement and insight appear normal. Mood & affect appropriate.    Data Reviewed:  Lab results reviewed.  Family Communication: None  Disposition: Status is: Inpatient Remains inpatient appropriate because: Severity of disease, high risk for deterioration.  IV treatment.     Time spent: 55 minutes  Author: Marrion Coy, MD 03/04/2023 10:42 AM  For on call review [email or web address].

## 2023-03-04 NOTE — Plan of Care (Signed)

## 2023-03-05 MED ORDER — QUETIAPINE FUMARATE 25 MG PO TABS
12.5000 mg | ORAL_TABLET | Freq: Every day | ORAL | Status: DC
  Administered 2023-03-05 – 2023-03-06 (×2): 12.5 mg via ORAL
  Filled 2023-03-05 (×2): qty 1

## 2023-03-05 MED ORDER — ADULT MULTIVITAMIN W/MINERALS CH
1.0000 | ORAL_TABLET | Freq: Every day | ORAL | Status: DC
Start: 2023-03-05 — End: 2023-03-07
  Administered 2023-03-05 – 2023-03-07 (×3): 1 via ORAL
  Filled 2023-03-05 (×3): qty 1

## 2023-03-05 MED ORDER — ENSURE ENLIVE PO LIQD
237.0000 mL | Freq: Three times a day (TID) | ORAL | Status: DC
  Administered 2023-03-05 – 2023-03-07 (×4): 237 mL via ORAL

## 2023-03-05 MED ORDER — LACTULOSE 10 GM/15ML PO SOLN
20.0000 g | Freq: Once | ORAL | Status: AC
  Administered 2023-03-05: 20 g via ORAL
  Filled 2023-03-05: qty 30

## 2023-03-05 MED ORDER — AZITHROMYCIN 250 MG PO TABS
500.0000 mg | ORAL_TABLET | Freq: Every day | ORAL | Status: AC
  Administered 2023-03-05 – 2023-03-06 (×2): 500 mg via ORAL
  Filled 2023-03-05 (×2): qty 2

## 2023-03-05 NOTE — Progress Notes (Signed)
Initial Nutrition Assessment  DOCUMENTATION CODES:   Underweight, Severe malnutrition in context of chronic illness  INTERVENTION:   -Ensure Enlive po TID, each supplement provides 350 kcal and 20 grams of protein  -MVI with minerals daily -Liberalize diet to regular for widest variety of meal selections   NUTRITION DIAGNOSIS:   Severe Malnutrition related to chronic illness (COPD) as evidenced by severe fat depletion, severe muscle depletion.  GOAL:   Patient will meet greater than or equal to 90% of their needs  MONITOR:   PO intake, Supplement acceptance  REASON FOR ASSESSMENT:   Consult Assessment of nutrition requirement/status  ASSESSMENT:   Pt with medical history significant of COPD not on oxygen, hypothyroidism, hypertension, who presents with shortness of  breath.  Pt admitted with severe sepsis secondary to RSV, COPD exacerbation, and rt lower lobe pneumonia.   Reviewed I/O's: +502 ml x 24 hours and +742 ml since admission  Spoke with pt at bedside, who was pleasant and in good spirits today. Pt reports feeling well and appetite has improved. She consumed about 50% of her soup today. Per pt, she also costumed a few bites of a [employer] biscuit earlier this AM. Pt reports she usually eats well, but did not eat well up until today due to being on Bi-pap.   Pt "eats all the time" at home. Pt reports grazing on snacks and consuming 2-3 meals per day (ex [employer] biscuits). Pt also drinks Ensure a few times a week and likes these.   Reviewed wt hx; wt has been stable over the past 10 months.   Discussed importance of good meal and supplement intake to promote healing. Pt amenable to Ensure.   Medications reviewed and include lovenox and solu-medrol.   Labs reviewed: Mg: 1.6, CBGS: 87 (inpatient orders for glycemic control are none).    NUTRITION - FOCUSED PHYSICAL EXAM:  Flowsheet Row Most Recent Value  Orbital Region Severe depletion  Upper Arm Region  Severe depletion  Thoracic and Lumbar Region Severe depletion  Buccal Region Severe depletion  Temple Region Severe depletion  Clavicle Bone Region Severe depletion  Clavicle and Acromion Bone Region Severe depletion  Scapular Bone Region Severe depletion  Dorsal Hand Severe depletion  Patellar Region Severe depletion  Anterior Thigh Region Severe depletion  Posterior Calf Region Severe depletion  Edema (RD Assessment) None  Hair Reviewed  Eyes Reviewed  Mouth Reviewed  Skin Reviewed  Nails Reviewed       Diet Order:   Diet Order             Diet regular Room service appropriate? Yes; Fluid consistency: Thin  Diet effective now                   EDUCATION NEEDS:   Education needs have been addressed  Skin:  Skin Assessment: Reviewed RN Assessment  Last BM:  03/01/23  Height:   Ht Readings from Last 1 Encounters:  03/02/23 5\' 5"  (1.651 m)    Weight:   Wt Readings from Last 1 Encounters:  03/02/23 37.2 kg    Ideal Body Weight:  56.8 kg  BMI:  Body mass index is 13.65 kg/m.  Estimated Nutritional Needs:   Kcal:  1500-1700  Protein:  75-90 grams  Fluid:  > 1.5 L    Levada Schilling, RD, LDN, CDCES Registered Dietitian III Certified Diabetes Care and Education Specialist If unable to reach this RD, please use "RD Inpatient" group chat on secure chat between hours of  8am-4 pm daily

## 2023-03-05 NOTE — Progress Notes (Signed)
Progress Note   Patient: April Sweeney [id number]:[id number] DOB: Oct 28, 1965 DOA: 03/02/2023     3 DOS: the patient was seen and examined on 03/05/2023   Brief hospital course: Emberlynn DANEAH MILLO is a 57 y.o. female with medical history significant of COPD not on oxygen, hypothyroidism, hypertension, who presents with shortness of  breath.  Symptoms started 3 days ago, progressively getting worse.  By time of arrival in the emergency room, she has severe hypoxia with oxygenation 85%.  CT chest ruled out PE, but showed right lower lobe patchy infiltrates.  She is also RSV positive. Patient started on IV antibiotics as well as steroids.  She is also requiring BiPAP to maintain oxygenation.   Principal Problem:   Acute respiratory failure with hypoxia (HCC) Active Problems:   COPD with acute exacerbation (HCC)   RSV infection   CAP (community acquired pneumonia)   Sepsis (HCC)   HTN (hypertension)   Hypothyroidism   Hypokalemia   Positive D-dimer   Smoker   Lung nodule   Protein-calorie malnutrition, severe (HCC)   Assessment and Plan: Acute respiratory failure with hypoxia and severe sepsis due to RSV infection, COPD exacerbation Right lower lobe viral pneumonia.   CTA negative for PE, but showed right lower lobe patchy infiltration.  Pt meets criteria for sepsis with WBC 12.7, heart rate of 110, RR 22.  Patient also had a lactic acid level 3.1, meet severe sepsis criteria. She has been requiring BiPAP since admission. 12/29. Patient was off BiPAP yesterday evening, but back on BiPAP earlier this morning.  I will increase steroids to 40 mg twice a day.  Continue Zithromax, continue bronchodilator.  12/30.  Patient still required BiPAP last night, BiPAP daytime as needed.  Still has significant respite distress.  Does not seem to have made much progress over the last 2 days, still a risk for intubation.  Patient still full code.  Short term prognosis guarded, long-term prognosis poor.   HTN  (hypertension):  Continue to follow.   Hypothyroidism -Synthroid   Hypokalemia Hypomagnesemia. Condition improved.   Positive D-dimer Bilateral lower extremity DVT negative.   Smoker -Nicotine patch   Lung nodule: This is incidental finding by CTA -Need to follow-up with PCP as outpatient   Protein-calorie malnutrition, severe (HCC): Body weight 77.2 kg, BMI 13.65 Patient has a severe malnutrition secondary to terminal COPD.  Long-term prognosis is very poor.      Subjective:  Patient still has significant respite distress, requiring BiPAP. She has a cough, nonproductive.  She could not sleep last night, she has some constipation.  Physical Exam: Vitals:   03/04/23 2123 03/05/23 0430 03/05/23 0821 03/05/23 1134  BP: 110/61 107/64 (!) 153/78 (!) 152/87  Pulse: 90 81 72 79  Resp: 16 19 18 20   Temp: 97.7 F (36.5 C) 97.9 F (36.6 C) 98.1 F (36.7 C) 98.7 F (37.1 C)  TempSrc: Oral Oral    SpO2: 93% 99% 94% 90%  Weight:      Height:       General exam: Short of breath with talking.  Cachectic. Respiratory system: Severe depressed breathing sounds, very little air movement. Respiratory effort normal. Cardiovascular system: S1 & S2 heard, RRR. No JVD, murmurs, rubs, gallops or clicks. No pedal edema. Gastrointestinal system: Abdomen is nondistended, soft and nontender. No organomegaly or masses felt. Normal bowel sounds heard. Central nervous system: Alert and oriented. No focal neurological deficits. Extremities: Severe muscle atrophy. Skin: No rashes, lesions or ulcers Psychiatry: Mood &  affect appropriate.    Data Reviewed:  There are no new results to review at this time.  Family Communication: Husband updated over the phone yesterday and today.  Disposition: Status is: Inpatient Remains inpatient appropriate because: Severity of disease, high risk of deterioration.     Time spent: 50 minutes  Author: Marrion Coy, MD 03/05/2023 12:49 PM  For on  call review [email or web address].

## 2023-03-06 LAB — LEGIONELLA PNEUMOPHILA SEROGP 1 UR AG: L. pneumophila Serogp 1 Ur Ag: NEGATIVE

## 2023-03-06 NOTE — Progress Notes (Signed)
 Progress Note   Patient: April Sweeney FMW:[id number] DOB: January 29, 1966 DOA: 03/02/2023     4 DOS: the patient was seen and examined on 03/06/2023   Brief hospital course: Maryam MAKENNA MACALUSO is a 57 y.o. female with medical history significant of COPD not on oxygen, hypothyroidism, hypertension, who presents with shortness of  breath.  Symptoms started 3 days ago, progressively getting worse.  By time of arrival in the emergency room, she has severe hypoxia with oxygenation 85%.  CT chest ruled out PE, but showed right lower lobe patchy infiltrates.  She is also RSV positive. Patient started on IV antibiotics as well as steroids.  She is also requiring BiPAP to maintain oxygenation. 12/31.  Patient is off BiPAP since yesterday evening, still on 4L oxygen.  Continue steroids and Zithromax .   Principal Problem:   Acute respiratory failure with hypoxia (HCC) Active Problems:   COPD with acute exacerbation (HCC)   RSV infection   CAP (community acquired pneumonia)   Sepsis (HCC)   HTN (hypertension)   Hypothyroidism   Hypokalemia   Positive D-dimer   Smoker   Lung nodule   Protein-calorie malnutrition, severe (HCC)   Assessment and Plan: Acute respiratory failure with hypoxia and severe sepsis due to RSV infection, COPD exacerbation Right lower lobe viral pneumonia.   CTA negative for PE, but showed right lower lobe patchy infiltration.  Pt meets criteria for sepsis with WBC 12.7, heart rate of 110, RR 22.  Patient also had a lactic acid level 3.1, meet severe sepsis criteria. She has been requiring BiPAP since admission. 12/29. Patient was off BiPAP yesterday evening, but back on BiPAP earlier this morning.  I will increase steroids to 40 mg twice a day.  Continue Zithromax , continue bronchodilator.  12/30.  Patient still required BiPAP last night, BiPAP daytime as needed.  Still has significant respite distress.  Does not seem to have made much progress over the last 2 days, still a risk for  intubation.  Patient still full code.  Short term prognosis guarded, long-term prognosis poor. 12/31.  Off BiPAP since last night.  On 4 L oxygen, feels better.  Continuing oxygen, may discharge home tomorrow if oxygen use is less than 2 L.   HTN (hypertension):  Continue to follow.   Hypothyroidism -Synthroid    Hypokalemia Hypomagnesemia. Condition improved.   Positive D-dimer Bilateral lower extremity DVT negative.   Smoker -Nicotine  patch   Lung nodule: This is incidental finding by CTA -Need to follow-up with PCP as outpatient   Protein-calorie malnutrition, severe (HCC): Body weight 77.2 kg, BMI 13.65 Patient has a severe malnutrition secondary to end stage COPD.  Long-term prognosis is very poor.               Subjective:  Finally doing better today, no longer has any respite distress today.  Still short of breath with minimal exertion, still on 4 L oxygen.  Physical Exam: Vitals:   03/06/23 0233 03/06/23 0441 03/06/23 0828 03/06/23 1232  BP:  135/80 127/64 (!) 140/65  Pulse:  87 98 87  Resp:  16 16 16   Temp:  98.2 F (36.8 C) 98.3 F (36.8 C) 98 F (36.7 C)  TempSrc:   Oral Oral  SpO2: 95% 98% 99% 98%  Weight:      Height:       General exam: Appears calm and comfortable, cachectic. Respiratory system: Decreased breath sounds. Respiratory effort normal. Cardiovascular system: S1 & S2 heard, RRR. No JVD, murmurs, rubs,  gallops or clicks. No pedal edema. Gastrointestinal system: Abdomen is nondistended, soft and nontender. No organomegaly or masses felt. Normal bowel sounds heard. Central nervous system: Alert and oriented. No focal neurological deficits. Extremities: Significant muscle atrophy. Skin: No rashes, lesions or ulcers Psychiatry: Judgement and insight appear normal. Mood & affect appropriate.    Data Reviewed:  Lab results reviewed.  Family Communication: Husband updated.  Disposition: Status is: Inpatient Remains inpatient  appropriate because: Severity of disease,     Time spent: 35 minutes  Author: Murvin Mana, MD 03/06/2023 1:20 PM  For on call review [email or web address]

## 2023-03-07 DIAGNOSIS — J9621 Acute and chronic respiratory failure with hypoxia: Secondary | ICD-10-CM

## 2023-03-07 LAB — CULTURE, BLOOD (ROUTINE X 2)
Culture: NO GROWTH
Culture: NO GROWTH
Special Requests: ADEQUATE

## 2023-03-07 NOTE — Discharge Summary (Signed)
 Physician Discharge Summary   Patient: April Sweeney MRN: [id number] DOB: [birth year]  Admit date:     03/02/2023  Discharge date: 03/07/23  Discharge Physician: AIDA CHO   PCP: [facility], [facility]   Recommendations at discharge:   Follow-up with PCP in 1 to 2 weeks Follow-up with PCP for repeat CT chest in 6 months to monitor right lower lung nodule.  Discharge Diagnoses: Principal Problem:   Acute on chronic respiratory failure with hypoxia (HCC) Active Problems:   COPD with acute exacerbation (HCC)   RSV infection   CAP (community acquired pneumonia)   Sepsis (HCC)   HTN (hypertension)   Hypothyroidism   Hypokalemia   Positive D-dimer   Smoker   Lung nodule   Protein-calorie malnutrition, severe (HCC)  Resolved Problems:   * No resolved hospital problems. [facility] Course:  Ms. April Sweeney is a 58 year old woman with medical history significant for COPD, hypothyroidism, hypertension, who presented to the hospital with worsening cough, chest tightness and shortness of breath.  He oxygen saturation was 85% on room air.  She also tested positive for RSV infection.   He was admitted to the hospital for RSV infection and COPD exacerbation.  She was treated with bronchodilators, empiric IV antibiotics and steroids.  She initially required BiPAP for acute hypoxic respiratory failure.  She was then transitioned to 4 L/min oxygen via nasal cannula.    Assessment and Plan:   Acute on chronic respiratory failure with hypoxia and severe sepsis due to RSV infection, COPD exacerbation Right lower lobe viral pneumonia.   She initially required BiPAP for acute respiratory failure. She was transition to 4 L/min oxygen via nasal cannula.  Unfortunately, she could not be weaned off of oxygen and she will need home oxygen. Oxygen saturation on room air at rest was 91%, oxygen saturation on room air while ambulating was 85% and oxygen saturation on 2 L of oxygen while  ambulating was 92%. CTA negative for PE, but showed right lower lobe patchy infiltration.      HTN (hypertension):  Outpatient follow-up with PCP    Hypothyroidism Continue Synthroid     Hypokalemia and hypomagnesemia Repleted    Positive D-dimer Bilateral lower extremity venous duplex was negative for DVT and CTA chest was negative for pulmonary embolism    Smoker Advised stop smoking cigarettes    Right lower lobe lung nodule: This is incidental finding by CTA This was discussed with the patient.  Repeat CT chest recommended in 6 months because of tobacco use disorder.    Protein-calorie malnutrition, severe (HCC): Encourage adequate oral intake.    Her condition has improved.  She said she feels better and wants to go home today.  She is deemed stable for discharge.  Discharge plan was discussed with patient and her husband at the bedside.       Consultants: None Procedures performed: None Disposition: Home Diet recommendation:  Discharge Diet Orders (From admission, onward)     Start     Ordered   03/07/23 0000  Diet - low sodium heart healthy        03/07/23 1112           Cardiac diet DISCHARGE MEDICATION: Allergies as of 03/07/2023       Reactions   Oxycodone Hives        Medication List     STOP taking these medications    VITAMIN K2-VITAMIN D3 PO       TAKE these medications  Advair Diskus 500-50 MCG/ACT Aepb Generic drug: fluticasone -salmeterol Inhale 1 puff into the lungs 2 (two) times daily.   AeroChamber Plus inhaler Use with inhaler   albuterol  108 (90 Base) MCG/ACT inhaler Commonly known as: VENTOLIN  HFA Inhale 2 puffs into the lungs every 6 (six) hours as needed for wheezing or shortness of breath.   albuterol  (2.5 MG/3ML) 0.083% nebulizer solution Commonly known as: PROVENTIL  Take 3 mLs (2.5 mg total) by nebulization every 6 (six) hours as needed for wheezing or shortness of breath.   fluticasone  50 MCG/ACT nasal  spray Commonly known as: FLONASE Place 1 spray into both nostrils daily as needed.   ipratropium 0.06 % nasal spray Commonly known as: ATROVENT  Place 2 sprays into both nostrils 4 (four) times daily.   levothyroxine  88 MCG tablet Commonly known as: SYNTHROID  Take 88 mcg by mouth daily before breakfast.   tiotropium 18 MCG inhalation capsule Commonly known as: SPIRIVA Place 18 mcg into inhaler and inhale every morning.   varenicline 1 MG tablet Commonly known as: CHANTIX Take 1 mg by mouth 2 (two) times daily. What changed: Another medication with the same name was removed. Continue taking this medication, and follow the directions you see here.   ZINC  PO Take 1 tablet by mouth at bedtime.               Durable Medical Equipment  (From admission, onward)           Start     Ordered   03/07/23 1111  DME Oxygen  Once       Question Answer Comment  Length of Need Lifetime   Mode or (Route) Nasal cannula   Liters per Minute 2   Frequency Continuous (stationary and portable oxygen unit needed)   Oxygen conserving device Yes   Oxygen delivery system Gas      03/07/23 1112            Discharge Exam: Filed Weights   03/02/23 1346  Weight: 37.2 kg   GEN: NAD SKIN: Warm and dry EYES: No pallor or icterus ENT: MMM CV: RRR PULM: Air entry adequate bilaterally.  No wheezing or rales. ABD: soft, ND, NT, +BS CNS: AAO x 3, non focal EXT: No edema or tenderness   Condition at discharge: good  The results of significant diagnostics from this hospitalization (including imaging, microbiology, ancillary and laboratory) are listed below for reference.   Imaging Studies: US  Venous Img Lower Bilateral (DVT) Result Date: 03/03/2023 CLINICAL DATA:  [id number] Positive D dimer [id number] EXAM: BILATERAL LOWER EXTREMITY VENOUS DOPPLER ULTRASOUND TECHNIQUE: Gray-scale sonography with graded compression, as well as color Doppler and duplex ultrasound were performed to  evaluate the lower extremity deep venous systems from the level of the common femoral vein and including the common femoral, femoral, profunda femoral, popliteal and calf veins including the posterior tibial, peroneal and gastrocnemius veins when visible. The superficial great saphenous vein was also interrogated. Spectral Doppler was utilized to evaluate flow at rest and with distal augmentation maneuvers in the common femoral, femoral and popliteal veins. COMPARISON:  None Available. FINDINGS: RIGHT LOWER EXTREMITY Common Femoral Vein: No evidence of thrombus. Normal compressibility, respiratory phasicity and response to augmentation. Saphenofemoral Junction: No evidence of thrombus. Normal compressibility and flow on color Doppler imaging. Profunda Femoral Vein: No evidence of thrombus. Normal compressibility and flow on color Doppler imaging. Femoral Vein: No evidence of thrombus. Normal compressibility, respiratory phasicity and response to augmentation. Popliteal Vein: No evidence of thrombus. Normal  compressibility, respiratory phasicity and response to augmentation. Calf Veins: No evidence of thrombus. Normal compressibility and flow on color Doppler imaging. Superficial Great Saphenous Vein: No evidence of thrombus. Normal compressibility. Venous Reflux:  None. Other Findings:  None. LEFT LOWER EXTREMITY Common Femoral Vein: No evidence of thrombus. Normal compressibility, respiratory phasicity and response to augmentation. Saphenofemoral Junction: No evidence of thrombus. Normal compressibility and flow on color Doppler imaging. Profunda Femoral Vein: No evidence of thrombus. Normal compressibility and flow on color Doppler imaging. Femoral Vein: No evidence of thrombus. Normal compressibility, respiratory phasicity and response to augmentation. Popliteal Vein: No evidence of thrombus. Normal compressibility, respiratory phasicity and response to augmentation. Calf Veins: No evidence of thrombus. Normal  compressibility and flow on color Doppler imaging. Superficial Great Saphenous Vein: No evidence of thrombus. Normal compressibility. Venous Reflux:  None. Other Findings:  None. IMPRESSION: No evidence of deep venous thrombosis in either lower extremity. Electronically Signed   By: Mabel Converse D.O.   On: 03/03/2023 10:17   CT Angio Chest PE W and/or Wo Contrast Result Date: 03/02/2023 CLINICAL DATA:  Increasing shortness of breath EXAM: CT ANGIOGRAPHY CHEST WITH CONTRAST TECHNIQUE: Multidetector CT imaging of the chest was performed using the standard protocol during bolus administration of intravenous contrast. Multiplanar CT image reconstructions and MIPs were obtained to evaluate the vascular anatomy. RADIATION DOSE REDUCTION: This exam was performed according to the departmental dose-optimization program which includes automated exposure control, adjustment of the mA and/or kV according to patient size and/or use of iterative reconstruction technique. CONTRAST:  75mL OMNIPAQUE  IOHEXOL  350 MG/ML SOLN COMPARISON:  Chest x-ray from earlier in the same day, CT from 12/20/2011, PET-CT from 02/16/2021 FINDINGS: Cardiovascular: Thoracic aorta and its branches are within normal limits. No aneurysmal dilatation or dissection is seen. No cardiac enlargement is noted. No significant coronary calcifications are noted. The pulmonary artery shows a normal branching pattern bilaterally. No filling defect to suggest pulmonary embolism is seen at this time. Mediastinum/Nodes: Thoracic inlet is within normal limits. Scattered calcified right hilar and mediastinal nodes are seen consistent with prior granulomatous disease. This is stable from the prior exam. The esophagus as visualized is within normal limits. Lungs/Pleura: Lungs are well aerated bilaterally. Patchy infiltrate is seen in the right lower lobe posteriorly. In the superior segment of the right lower lobe there is a somewhat more nodular density measuring up  to 8.5 mm. This may also be postinflammatory in nature. Severe emphysematous changes are noted in the upper lobes bilaterally. Upper Abdomen: Visualized upper abdomen shows a few calcified splenic granulomas. Musculoskeletal: No chest wall abnormality. No acute or significant osseous findings. Review of the MIP images confirms the above findings. IMPRESSION: No evidence of pulmonary emboli. Patchy right lower lobe infiltrate new from the prior exam New 8.5 mm nodular density in the superior segment right lower lobe. Although this may be postinflammatory in nature a Non-contrast chest CT at 6-12 months is recommended. If the nodule is stable at time of repeat CT, then future CT at 18-24 months (from today's scan) is considered optional for low-risk patients, but is recommended for high-risk patients. This recommendation follows the consensus statement: Guidelines for Management of Incidental Pulmonary Nodules Detected on CT Images: From the Fleischner Society 2017; Radiology 2017; 284:228-243. Changes consistent with prior granulomatous disease. Emphysema (ICD10-J43.9). Electronically Signed   By: Oneil Devonshire M.D.   On: 03/02/2023 18:30   DG Chest 2 View Result Date: 03/02/2023 CLINICAL DATA:  Shortness of breath. EXAM: CHEST -  2 VIEW COMPARISON:  11/13/2022. FINDINGS: Bilateral lungs appear hyperexpanded and hyperlucent with coarse bronchovascular markings, in keeping with COPD. Bilateral lungs otherwise appear clear. No dense consolidation or lung collapse. Bilateral costophrenic angles are clear. Normal cardio-mediastinal silhouette. No acute osseous abnormalities. The soft tissues are within normal limits. IMPRESSION: No active cardiopulmonary disease.  COPD. Electronically Signed   By: Ree Molt M.D.   On: 03/02/2023 15:44    Microbiology: Results for orders placed or performed during the hospital encounter of 03/02/23  Resp panel by RT-PCR (RSV, Flu A&B, Covid) Anterior Nasal Swab     Status:  Abnormal   Collection Time: 03/02/23  2:11 PM   Specimen: Anterior Nasal Swab  Result Value Ref Range Status   SARS Coronavirus 2 by RT PCR NEGATIVE NEGATIVE Final    Comment: (NOTE) SARS-CoV-2 target nucleic acids are NOT DETECTED.  The SARS-CoV-2 RNA is generally detectable in upper respiratory specimens during the acute phase of infection. The lowest concentration of SARS-CoV-2 viral copies this assay can detect is 138 copies/mL. A negative result does not preclude SARS-Cov-2 infection and should not be used as the sole basis for treatment or other patient management decisions. A negative result may occur with  improper specimen collection/handling, submission of specimen other than nasopharyngeal swab, presence of viral mutation(s) within the areas targeted by this assay, and inadequate number of viral copies(<138 copies/mL). A negative result must be combined with clinical observations, patient history, and epidemiological information. The expected result is Negative.  Fact Sheet for Patients:  bloggercourse.com  Fact Sheet for Healthcare Providers:  seriousbroker.it  This test is no t yet approved or cleared by the [place]  FDA and  has been authorized for detection and/or diagnosis of SARS-CoV-2 by FDA under an Emergency Use Authorization (EUA). This EUA will remain  in effect (meaning this test can be used) for the duration of the COVID-19 declaration under Section 564(b)(1) of the Act, 21 U.S.C.section 360bbb-3(b)(1), unless the authorization is terminated  or revoked sooner.       Influenza A by PCR NEGATIVE NEGATIVE Final   Influenza B by PCR NEGATIVE NEGATIVE Final    Comment: (NOTE) The Xpert Xpress SARS-CoV-2/FLU/RSV plus assay is intended as an aid in the diagnosis of influenza from Nasopharyngeal swab specimens and should not be used as a sole basis for treatment. Nasal washings and aspirates are  unacceptable for Xpert Xpress SARS-CoV-2/FLU/RSV testing.  Fact Sheet for Patients: bloggercourse.com  Fact Sheet for Healthcare Providers: seriousbroker.it  This test is not yet approved or cleared by the [place]  FDA and has been authorized for detection and/or diagnosis of SARS-CoV-2 by FDA under an Emergency Use Authorization (EUA). This EUA will remain in effect (meaning this test can be used) for the duration of the COVID-19 declaration under Section 564(b)(1) of the Act, 21 U.S.C. section 360bbb-3(b)(1), unless the authorization is terminated or revoked.     Resp Syncytial Virus by PCR POSITIVE (A) NEGATIVE Final    Comment: (NOTE) Fact Sheet for Patients: bloggercourse.com  Fact Sheet for Healthcare Providers: seriousbroker.it  This test is not yet approved or cleared by the [place]  FDA and has been authorized for detection and/or diagnosis of SARS-CoV-2 by FDA under an Emergency Use Authorization (EUA). This EUA will remain in effect (meaning this test can be used) for the duration of the COVID-19 declaration under Section 564(b)(1) of the Act, 21 U.S.C. section 360bbb-3(b)(1), unless the authorization is terminated or revoked.  Performed at [facility],  [address], [place], [place] [address]   Culture, blood (routine x 2) Call MD if unable to obtain prior to antibiotics being given     Status: None   Collection Time: 03/02/23 10:30 PM   Specimen: BLOOD  Result Value Ref Range Status   Specimen Description BLOOD BLOOD LEFT ARM  Final   Special Requests   Final    BOTTLES DRAWN AEROBIC AND ANAEROBIC Blood Culture adequate volume   Culture   Final    NO GROWTH 5 DAYS Performed at [facility], [address] [place], [place] [address]    Report Status 03/07/2023 FINAL  Final  Culture, blood (routine x 2) Call MD if unable to  obtain prior to antibiotics being given     Status: None   Collection Time: 03/02/23 10:38 PM   Specimen: BLOOD  Result Value Ref Range Status   Specimen Description BLOOD BLOOD LEFT HAND  Final   Special Requests   Final    BOTTLES DRAWN AEROBIC AND ANAEROBIC Blood Culture results may not be optimal due to an inadequate volume of blood received in culture bottles   Culture   Final    NO GROWTH 5 DAYS Performed at [facility], [address] [place], [place] [address]    Report Status 03/07/2023 FINAL  Final    Labs: CBC: Recent Labs  Lab 03/02/23 1411 03/03/23 0213  WBC 12.7* 10.2  NEUTROABS 10.8*  --   HGB 13.4 13.0  HCT 40.9 40.2  MCV 101.5* 99.8  PLT 281 301   Basic Metabolic Panel: Recent Labs  Lab 03/02/23 1550 03/03/23 0213  NA 134* 140  K 3.1* 4.2  CL 90* 98  CO2 32 31  GLUCOSE 160* 120*  BUN 9 7  CREATININE 0.58 0.49  CALCIUM 8.3* 9.2  MG 1.6*  --   PHOS 3.1  --    Liver Function Tests: Recent Labs  Lab 03/02/23 1550  AST 19  ALT 15  ALKPHOS 83  BILITOT 0.5  PROT 6.6  ALBUMIN 3.4*   CBG: No results for input(s): GLUCAP in the last 168 hours.  Discharge time spent: greater than 30 minutes.  Signed: AIDA CHO, MD [facility] 03/07/2023

## 2023-03-07 NOTE — Progress Notes (Signed)
 SATURATION QUALIFICATIONS: (This note is used to comply with regulatory documentation for home oxygen)  Patient Saturations on Room Air at Rest = 91%  Patient Saturations on Room Air while Ambulating = 85%  Patient Saturations on 2 Liters of oxygen while Ambulating = 92%  Please briefly explain why patient needs home oxygen:  Patient o2 sat at rest 96% with [facility], Room Air Saturation 91%. Desats to 88% with activity and with ambulation quickly desaturated to 85%. Placed on 3LNC initially and decreased to [facility] after returning to 89%. Patient left on [facility] with an 02 saturation of 92%.

## 2023-03-07 NOTE — TOC Transition Note (Addendum)
 Transition of Care [facility]) - Discharge Note   Patient Details  Name: April Sweeney MRN: [id number] Date of Birth: 09/25/1965  Transition of Care [facility]) CM/SW Contact:  Lauraine JAYSON Carpen, LCSW Phone Number: 03/07/2023, 11:50 AM   Clinical Narrative:  Patient has orders to discharge home today and qualifies for home oxygen. Patient confirmed she does not have insurance. CSW ordered charity oxygen through [employer]. No further concerns. CSW signing off.   1:55 pm: Per [employer], patient got charity oxygen a couple of years ago. Patient and husband confirmed it was picked up and the bill was paid. [employer] needs a card on file before the can deliver the oxygen. They will call patient and/or husband to obtain.  2:05 pm: [employer] has a card on file. Delivery driver is assigned to delivery a tank to the hospital and complete home setup.  Final next level of care: Home/Self Care Barriers to Discharge: No Barriers Identified   Patient Goals and CMS Choice            Discharge Placement                Patient to be transferred to facility by: Husband Name of family member notified: Lamar Bucco Patient and family notified of of transfer: 03/07/23  Discharge Plan and Services Additional resources added to the After Visit Summary for                  DME Arranged: Oxygen DME Agency: [employer] Date DME Agency Contacted: 03/07/23   Representative spoke with at DME Agency: Thomasina [place]            Social Drivers of Health (SDOH) Interventions SDOH Screenings   Food Insecurity: No Food Insecurity (03/03/2023)  Housing: Low Risk  (03/03/2023)  Transportation Needs: No Transportation Needs (03/03/2023)  Utilities: Not At Risk (03/03/2023)  Tobacco Use: High Risk (03/02/2023)     Readmission Risk Interventions     No data to display

## 2023-05-08 ENCOUNTER — Ambulatory Visit
Admission: EM | Admit: 2023-05-08 | Discharge: 2023-05-08 | Payer: Self-pay | Attending: Emergency Medicine | Admitting: Emergency Medicine

## 2023-05-08 DIAGNOSIS — H538 Other visual disturbances: Secondary | ICD-10-CM

## 2023-05-08 DIAGNOSIS — H547 Unspecified visual loss: Secondary | ICD-10-CM

## 2023-05-08 DIAGNOSIS — M545 Low back pain, unspecified: Secondary | ICD-10-CM

## 2023-05-08 NOTE — Discharge Instructions (Signed)
 I am concerned that you may have had a TIA, or mini stroke yesterday with your 8 hours of blurry vision/visual loss that is different from the blurry vision you get with your migraines.  I am transferring to the emergency department for further evaluation of this, even though you are completely neurologically intact at this time.  Please go immediately to the emergency department.  I also believe that you have strained your right lumbar region.  I will leave further treatment of this up to the emergency department.

## 2023-05-08 NOTE — ED Provider Notes (Signed)
 HPI  SUBJECTIVE:  April Sweeney is a 58 y.o. female who presents with 2 issues: First, she reports the acute onset of constant, right low back pain scribed as soreness that becomes sharp with movement starting after she was bending forward, putting on some pants yesterday.  States that she felt a pop followed by pain.  She denies direct trauma to the back, saddle anesthesia, urinary fecal incontinence, urinary retention, bilateral radicular pain, leg weakness.  She has tried ibuprofen, heating pad and Goody's powders without improvement in her symptoms.  Symptoms are worse when she tries to bend over.  She also reports having an episode of blurry vision/visual loss described as "I could not see half" starting at 0800 yesterday lasting until 1600 after injuring her back.  She reports a slight frontal headache during this time.  No nausea, vomiting, numbness/tingling/weakness in her face/arm or leg, slurred speech, aphasia.  She attributed this visual change to a migraine, as she has had blurry vision with previous migraines, but she states that blurry vision was different.  She describes the blurry vision with her migraines as like looking through a window with water coming down it rather than visual loss.  She has no visual complaints today.  Past medical history of COPD, hypertension, hypothyroidism, sepsis, migraines with visual aura.  She states that she has not had a migraine in a long time.  No history of CVA, TIA, diabetes, coronary disease carotid artery disease, hypercholesterolemia, osteoporosis, hypertension.  She does not wear glasses or contacts.  PCP: [facility] he will.  Past Medical History:  Diagnosis Date   COPD (chronic obstructive pulmonary disease) (HCC)    Influenza A 02/13/2021   Thyroid disease    Wears dentures    full upper, partial lower    Past Surgical History:  Procedure Laterality Date   BREAST CYST ASPIRATION Left 1992   CESAREAN SECTION      COLONOSCOPY WITH PROPOFOL N/A 05/16/2022   Procedure: COLONOSCOPY WITH PROPOFOL;  Surgeon: Toney Reil, MD;  Location: [facility];  Service: Endoscopy;  Laterality: N/A;   POLYPECTOMY  05/16/2022   Procedure: POLYPECTOMY;  Surgeon: Toney Reil, MD;  Location: [facility];  Service: Endoscopy;;   WRIST GANGLION EXCISION      Family History  Problem Relation Age of Onset   Dementia Mother    Cancer Father    Liver cancer Father    Breast cancer Neg Hx     Social History   Tobacco Use   Smoking status: Every Day    Current packs/day: 0.25    Average packs/day: 0.3 packs/day for 40.0 years (10.0 ttl pk-yrs)    Types: Cigarettes   Smokeless tobacco: Never   Tobacco comments:    Started smoking in early teens  Vaping Use   Vaping status: Never Used  Substance Use Topics   Alcohol use: No   Drug use: Never    No current facility-administered medications for this encounter.  Current Outpatient Medications:    ADVAIR DISKUS 500-50 MCG/ACT AEPB, Inhale 1 puff into the lungs 2 (two) times daily., Disp: , Rfl:    albuterol (PROVENTIL) (2.5 MG/3ML) 0.083% nebulizer solution, Take 3 mLs (2.5 mg total) by nebulization every 6 (six) hours as needed for wheezing or shortness of breath., Disp: 75 mL, Rfl: 1   albuterol (VENTOLIN HFA) 108 (90 Base) MCG/ACT inhaler, Inhale 2 puffs into the lungs every 6 (six) hours as needed for wheezing or shortness  of breath., Disp: 8 g, Rfl: 0   fluticasone (FLONASE) 50 MCG/ACT nasal spray, Place 1 spray into both nostrils daily as needed., Disp: , Rfl:    ipratropium (ATROVENT) 0.06 % nasal spray, Place 2 sprays into both nostrils 4 (four) times daily., Disp: , Rfl:    levothyroxine (SYNTHROID) 88 MCG tablet, Take 88 mcg by mouth daily before breakfast., Disp: , Rfl:    Multiple Vitamins-Minerals (ZINC PO), Take 1 tablet by mouth at bedtime., Disp: , Rfl:    Spacer/Aero-Holding Chambers (AEROCHAMBER PLUS) inhaler, Use with  inhaler, Disp: 1 each, Rfl: 2   tiotropium (SPIRIVA) 18 MCG inhalation capsule, Place 18 mcg into inhaler and inhale every morning., Disp: , Rfl:    varenicline (CHANTIX) 1 MG tablet, Take 1 mg by mouth 2 (two) times daily., Disp: , Rfl:   Allergies  Allergen Reactions   Oxycodone Hives     ROS  As noted in HPI.   Physical Exam  BP 119/80 (BP Location: Left Arm)   Pulse 96   Temp 98.6 F (37 C) (Oral)   Resp 19   LMP 02/17/2014   SpO2 95%   Constitutional: Well developed, well nourished, no acute distress Eyes: PERRL, EOMI, conjunctiva normal bilaterally.  All visual fields intact to confrontation   Visual Acuity  R: 20/100 B:20/70 L: 20/100      HENT: Normocephalic, atraumatic,mucus membranes moist Respiratory: Normal inspiratory effort Cardiovascular: Normal rate and rhythm, no murmurs, no gallops, no rubs no carotid bruit GI: Distended skin: No rash, skin intact Back: Positive right paralumbar tenderness, no muscle spasm. No L spine, sacral, SI joint bony tenderness.  Bilateral lower extremities nontender.  baseline ROM with intact PT pulses, Sensation intact to light touch bilaterally over both legs, DTR's symmetric and intact bilaterally KJ, pain aggravated with right hip flexion against resistance.  Motor symmetric bilateral 5/5 hip flexion, quadriceps, hamstrings, EHL, foot dorsiflexion, foot plantarflexion, gait normal Musculoskeletal: No edema, no tenderness, no deformities Neurologic: Alert & oriented x 3, CN III-XII  intact, no motor deficits, sensation grossly intact.  Finger-to-nose, heel shin within normal limits.  Tandem gait steady. Psychiatric: Speech and behavior appropriate   ED Course   Medications - No data to display  Orders Placed This Encounter  Procedures   Visual acuity screening    Standing Status:   Standing    Number of Occurrences:   1   No results found for this or any previous visit (from the past 24 hours). No results  found.  ED Clinical Impression  1. Visual loss   2. Blurry vision   3. Acute right-sided low back pain, unspecified whether sciatica present      ED Assessment/Plan     1.  Acute blurry vision/visual loss that lasted for 8 hours yesterday.  Patient states that vision is back to normal she is completely neurologically intact at this time.  However, I am concerned that she may have had a TIA yesterday as she states that this episode of blurry vision/visual loss was different than her usual blurry vision that she gets with migraines.  Transferring to the emergency department for further workup.  Advised her to go to [facility] or [place].  I feel that she is stable to go by private vehicle.  She states that her daughter will drive her.  2.  Right-sided lumbar strain.  She has no bony tenderness.  I suspect she will do well with NSAIDs/Tylenol, muscle relaxant possibly steroids.  Will  defer this to the emergency department.  Rationale for transfer to the emergency department with the patient.  She agrees to go.  She will have a family member drive her.  No orders of the defined types were placed in this encounter.     *This clinic note was created using Dragon dictation software. Therefore, there may be occasional mistakes despite careful proofreading. ?    Domenick Gong, MD 05/08/23 1143

## 2023-05-08 NOTE — ED Triage Notes (Signed)
 Patient states that she was putting on some pants. Her back popped while she was lifting her leg to put in the pant leg. Lower back pain. When her back popped her vision went blurry. She has a hx of blurry vision. Vision got better around 4 pm.   R: 20/100 B:20/70 L: 20/100

## 2023-05-08 NOTE — ED Notes (Signed)
 Patient is being discharged from the Urgent Care and sent to the Emergency Department via POV . Per Dr.Mortenson, patient is in need of higher level of care due to vision loss & lumbar strain. Patient is aware and verbalizes understanding of plan of care.  Vitals:   05/08/23 1036  BP: 119/80  Pulse: 96  Resp: 19  Temp: 98.6 F (37 C)  SpO2: 95%

## 2023-07-16 ENCOUNTER — Other Ambulatory Visit: Payer: Self-pay

## 2023-07-16 DIAGNOSIS — Z87891 Personal history of nicotine dependence: Secondary | ICD-10-CM

## 2023-07-17 ENCOUNTER — Other Ambulatory Visit: Payer: Self-pay

## 2023-07-17 DIAGNOSIS — Z87891 Personal history of nicotine dependence: Secondary | ICD-10-CM

## 2023-07-19 ENCOUNTER — Ambulatory Visit
Admission: RE | Admit: 2023-07-19 | Discharge: 2023-07-19 | Disposition: A | Discharge status: Home / Self Care | Payer: Self-pay | Source: Ambulatory Visit

## 2023-07-19 DIAGNOSIS — Z87891 Personal history of nicotine dependence: Secondary | ICD-10-CM
